# Patient Record
Sex: Female | Born: 1965 | Race: White | Hispanic: No | Smoking: Never smoker
Health system: Southern US, Community
[De-identification: ages and names within clinical notes are randomized; demographics above are authoritative.]

## PROBLEM LIST (undated history)

## (undated) DIAGNOSIS — J45909 Unspecified asthma, uncomplicated: Secondary | ICD-10-CM

## (undated) DIAGNOSIS — T7840XA Allergy, unspecified, initial encounter: Secondary | ICD-10-CM

## (undated) DIAGNOSIS — I10 Essential (primary) hypertension: Secondary | ICD-10-CM

## (undated) DIAGNOSIS — E785 Hyperlipidemia, unspecified: Secondary | ICD-10-CM

## (undated) HISTORY — PX: BREAST BIOPSY: SHX20

## (undated) HISTORY — DX: Allergy, unspecified, initial encounter: T78.40XA

## (undated) HISTORY — DX: Hyperlipidemia, unspecified: E78.5

## (undated) HISTORY — PX: THERAPEUTIC ABORTION: SHX798

## (undated) HISTORY — DX: Essential (primary) hypertension: I10

## (undated) HISTORY — PX: TUBAL LIGATION: SHX77

## (undated) HISTORY — PX: ENDOMETRIAL ABLATION: SHX621

## (undated) HISTORY — DX: Unspecified asthma, uncomplicated: J45.909

---

## 1997-08-03 HISTORY — PX: BREAST LUMPECTOMY: SHX2

## 2015-08-04 HISTORY — PX: REDUCTION MAMMAPLASTY: SUR839

## 2017-06-10 LAB — HM PAP SMEAR: HM Pap smear: NEGATIVE

## 2017-08-03 HISTORY — PX: COLONOSCOPY: SHX174

## 2018-08-03 DIAGNOSIS — J069 Acute upper respiratory infection, unspecified: Secondary | ICD-10-CM | POA: Diagnosis not present

## 2018-08-03 DIAGNOSIS — J45909 Unspecified asthma, uncomplicated: Secondary | ICD-10-CM | POA: Diagnosis not present

## 2018-08-27 DIAGNOSIS — J209 Acute bronchitis, unspecified: Secondary | ICD-10-CM | POA: Diagnosis not present

## 2018-09-20 DIAGNOSIS — N390 Urinary tract infection, site not specified: Secondary | ICD-10-CM | POA: Diagnosis not present

## 2018-09-20 DIAGNOSIS — I10 Essential (primary) hypertension: Secondary | ICD-10-CM | POA: Diagnosis not present

## 2018-09-20 DIAGNOSIS — Z0000 Encounter for general adult medical examination without abnormal findings: Secondary | ICD-10-CM | POA: Diagnosis not present

## 2018-09-20 DIAGNOSIS — J45990 Exercise induced bronchospasm: Secondary | ICD-10-CM | POA: Diagnosis not present

## 2018-09-20 DIAGNOSIS — J45998 Other asthma: Secondary | ICD-10-CM | POA: Diagnosis not present

## 2018-11-14 DIAGNOSIS — J0100 Acute maxillary sinusitis, unspecified: Secondary | ICD-10-CM | POA: Diagnosis not present

## 2019-01-13 DIAGNOSIS — Z0001 Encounter for general adult medical examination with abnormal findings: Secondary | ICD-10-CM | POA: Diagnosis not present

## 2019-01-13 DIAGNOSIS — N951 Menopausal and female climacteric states: Secondary | ICD-10-CM | POA: Diagnosis not present

## 2019-01-13 DIAGNOSIS — Z03818 Encounter for observation for suspected exposure to other biological agents ruled out: Secondary | ICD-10-CM | POA: Diagnosis not present

## 2019-01-13 DIAGNOSIS — R5383 Other fatigue: Secondary | ICD-10-CM | POA: Diagnosis not present

## 2019-01-23 DIAGNOSIS — N951 Menopausal and female climacteric states: Secondary | ICD-10-CM | POA: Diagnosis not present

## 2019-01-25 ENCOUNTER — Other Ambulatory Visit: Payer: Self-pay | Admitting: Family Medicine

## 2019-01-25 DIAGNOSIS — R102 Pelvic and perineal pain: Secondary | ICD-10-CM

## 2019-01-25 DIAGNOSIS — Z1231 Encounter for screening mammogram for malignant neoplasm of breast: Secondary | ICD-10-CM

## 2019-02-20 ENCOUNTER — Encounter: Payer: Self-pay | Admitting: Podiatry

## 2019-02-20 ENCOUNTER — Other Ambulatory Visit: Payer: Self-pay | Admitting: Podiatry

## 2019-02-20 ENCOUNTER — Other Ambulatory Visit: Payer: Self-pay

## 2019-02-20 ENCOUNTER — Ambulatory Visit: Payer: BC Managed Care – PPO | Admitting: Podiatry

## 2019-02-20 ENCOUNTER — Ambulatory Visit (INDEPENDENT_AMBULATORY_CARE_PROVIDER_SITE_OTHER): Payer: BC Managed Care – PPO

## 2019-02-20 VITALS — BP 115/75 | HR 69 | Temp 98.2°F | Resp 16

## 2019-02-20 DIAGNOSIS — M79671 Pain in right foot: Secondary | ICD-10-CM

## 2019-02-20 DIAGNOSIS — M722 Plantar fascial fibromatosis: Secondary | ICD-10-CM | POA: Diagnosis not present

## 2019-02-20 MED ORDER — DICLOFENAC SODIUM 75 MG PO TBEC: 75.0000 mg | DELAYED_RELEASE_TABLET | Freq: Two times a day (BID) | ORAL | 2 refills | Status: DC

## 2019-02-20 NOTE — Progress Notes (Signed)
   Subjective:    Patient ID: Andrea Hall, female    DOB: 10-05-1965, 53 y.o.   MRN: [id number]  HPI    Review of Systems  All other systems reviewed and are negative.      Objective:   Physical Exam        Assessment & Plan:

## 2019-02-20 NOTE — Patient Instructions (Signed)

## 2019-02-21 NOTE — Progress Notes (Signed)
Subjective:   Patient ID: Andrea Hall, female   DOB: 53 y.o.   MRN: [id number]   HPI Patient states having a lot of pain in the bottom of the right heel and states it is been present for least a month and maybe longer and worse when she gets up in the morning after sitting and has had history of this problem in the past.  Patient does not smoke likes to be active   Review of Systems  All other systems reviewed and are negative.       Objective:  Physical Exam Vitals signs and nursing note reviewed.  Constitutional:      Appearance: She is well-developed.  Pulmonary:     Effort: Pulmonary effort is normal.  Musculoskeletal: Normal range of motion.  Skin:    General: Skin is warm.  Neurological:     Mental Status: She is alert.     Neurovascular status intact muscle strength adequate range of motion within normal limits with patient found to have severe discomfort plantar aspect right heel at the insertion of the tendon into the calcaneus with inflammation fluid around the medial band and moderate depression of the arch     Assessment:  Acute plantar fasciitis right with inflammation fluid buildup     Plan:  H&P condition reviewed and recommended conservative treatment.  Sterile prep applied and injected the right plantar fascia 3 mg Kenalog 5 mg Xylocaine and applied fascial brace with instructions on usage along with physical therapy and placed on diclofenac 75 mg twice daily.  Reappoint 2 weeks to recheck and consider orthotics  X-rays indicate that there is spur formation with no indication to stress fracture arthritis

## 2019-03-13 ENCOUNTER — Ambulatory Visit: Payer: BC Managed Care – PPO | Admitting: Podiatry

## 2019-03-27 ENCOUNTER — Ambulatory Visit: Payer: Self-pay | Admitting: Podiatry

## 2019-03-28 ENCOUNTER — Ambulatory Visit: Payer: Self-pay

## 2019-06-15 ENCOUNTER — Ambulatory Visit (INDEPENDENT_AMBULATORY_CARE_PROVIDER_SITE_OTHER): Payer: Self-pay | Admitting: Bariatrics

## 2019-06-19 DIAGNOSIS — Z20828 Contact with and (suspected) exposure to other viral communicable diseases: Secondary | ICD-10-CM | POA: Diagnosis not present

## 2019-06-21 ENCOUNTER — Ambulatory Visit (INDEPENDENT_AMBULATORY_CARE_PROVIDER_SITE_OTHER): Payer: Self-pay | Admitting: Bariatrics

## 2019-08-23 DIAGNOSIS — J45909 Unspecified asthma, uncomplicated: Secondary | ICD-10-CM | POA: Diagnosis not present

## 2019-09-12 DIAGNOSIS — Z20828 Contact with and (suspected) exposure to other viral communicable diseases: Secondary | ICD-10-CM | POA: Diagnosis not present

## 2019-09-15 DIAGNOSIS — M62830 Muscle spasm of back: Secondary | ICD-10-CM | POA: Diagnosis not present

## 2019-09-15 DIAGNOSIS — M9905 Segmental and somatic dysfunction of pelvic region: Secondary | ICD-10-CM | POA: Diagnosis not present

## 2019-09-15 DIAGNOSIS — M9902 Segmental and somatic dysfunction of thoracic region: Secondary | ICD-10-CM | POA: Diagnosis not present

## 2019-09-15 DIAGNOSIS — M9903 Segmental and somatic dysfunction of lumbar region: Secondary | ICD-10-CM | POA: Diagnosis not present

## 2019-09-19 ENCOUNTER — Ambulatory Visit
Admission: RE | Admit: 2019-09-19 | Discharge: 2019-09-19 | Disposition: A | Discharge status: Home / Self Care | Payer: BC Managed Care – PPO | Source: Ambulatory Visit | Attending: Family Medicine | Admitting: Family Medicine

## 2019-09-19 ENCOUNTER — Other Ambulatory Visit: Payer: Self-pay

## 2019-09-19 DIAGNOSIS — Z1231 Encounter for screening mammogram for malignant neoplasm of breast: Secondary | ICD-10-CM | POA: Diagnosis not present

## 2019-09-19 LAB — HM MAMMOGRAPHY

## 2019-10-06 DIAGNOSIS — M9902 Segmental and somatic dysfunction of thoracic region: Secondary | ICD-10-CM | POA: Diagnosis not present

## 2019-10-06 DIAGNOSIS — M62830 Muscle spasm of back: Secondary | ICD-10-CM | POA: Diagnosis not present

## 2019-10-06 DIAGNOSIS — M9903 Segmental and somatic dysfunction of lumbar region: Secondary | ICD-10-CM | POA: Diagnosis not present

## 2019-10-06 DIAGNOSIS — M9905 Segmental and somatic dysfunction of pelvic region: Secondary | ICD-10-CM | POA: Diagnosis not present

## 2019-10-09 DIAGNOSIS — Z20828 Contact with and (suspected) exposure to other viral communicable diseases: Secondary | ICD-10-CM | POA: Diagnosis not present

## 2019-10-14 ENCOUNTER — Ambulatory Visit: Payer: BC Managed Care – PPO | Attending: Internal Medicine

## 2019-10-14 DIAGNOSIS — Z23 Encounter for immunization: Secondary | ICD-10-CM

## 2019-10-14 NOTE — Progress Notes (Signed)
   Covid-19 Vaccination Clinic  Name:  Juletta Berhe    MRN: [id number] DOB: 04-Nov-1965  10/14/2019  Ms. Mancia was observed post Covid-19 immunization for 15 minutes without incident. She was provided with Vaccine Information Sheet and instruction to access the V-Safe system.   Ms. Minami was instructed to call 911 with any severe reactions post vaccine: Marland Kitchen Difficulty breathing  . Swelling of face and throat  . A fast heartbeat  . A bad rash all over body  . Dizziness and weakness   Immunizations Administered    Name Date Dose VIS Date Route   Pfizer COVID-19 Vaccine 10/14/2019  8:44 AM 0.3 mL 07/14/2019 Intramuscular   Manufacturer: [employer], [employer]   Lot: [id number]   NDC: [address]-4

## 2019-11-06 ENCOUNTER — Ambulatory Visit: Payer: BC Managed Care – PPO

## 2019-11-06 ENCOUNTER — Ambulatory Visit: Payer: BC Managed Care – PPO | Attending: Internal Medicine

## 2019-11-06 DIAGNOSIS — Z23 Encounter for immunization: Secondary | ICD-10-CM

## 2019-11-06 NOTE — Progress Notes (Signed)
   Covid-19 Vaccination Clinic  Name:  Andrea Hall    MRN: [id number] DOB: [birth year]  11/06/2019  [name] was observed post Covid-19 immunization for 15 minutes without incident. She was provided with Vaccine Information Sheet and instruction to access the V-Safe system.   [name] was instructed to call 911 with any severe reactions post vaccine: Marland Kitchen Difficulty breathing  . Swelling of face and throat  . A fast heartbeat  . A bad rash all over body  . Dizziness and weakness   Immunizations Administered    Name Date Dose VIS Date Route   Pfizer COVID-19 Vaccine 11/06/2019  3:58 PM 0.3 mL 07/14/2019 Intramuscular   Manufacturer: [employer], [employer]   Lot: [id number]   NDC: [address]-0

## 2020-01-25 DIAGNOSIS — E538 Deficiency of other specified B group vitamins: Secondary | ICD-10-CM | POA: Diagnosis not present

## 2020-01-25 DIAGNOSIS — R635 Abnormal weight gain: Secondary | ICD-10-CM | POA: Diagnosis not present

## 2020-01-25 DIAGNOSIS — E559 Vitamin D deficiency, unspecified: Secondary | ICD-10-CM | POA: Diagnosis not present

## 2020-01-25 DIAGNOSIS — J45909 Unspecified asthma, uncomplicated: Secondary | ICD-10-CM | POA: Diagnosis not present

## 2020-01-25 DIAGNOSIS — L918 Other hypertrophic disorders of the skin: Secondary | ICD-10-CM | POA: Diagnosis not present

## 2020-01-25 DIAGNOSIS — N951 Menopausal and female climacteric states: Secondary | ICD-10-CM | POA: Diagnosis not present

## 2020-03-08 DIAGNOSIS — Z9189 Other specified personal risk factors, not elsewhere classified: Secondary | ICD-10-CM | POA: Diagnosis not present

## 2020-03-08 DIAGNOSIS — J45909 Unspecified asthma, uncomplicated: Secondary | ICD-10-CM | POA: Diagnosis not present

## 2020-03-08 DIAGNOSIS — E612 Magnesium deficiency: Secondary | ICD-10-CM | POA: Diagnosis not present

## 2020-03-08 DIAGNOSIS — N951 Menopausal and female climacteric states: Secondary | ICD-10-CM | POA: Diagnosis not present

## 2020-05-14 DIAGNOSIS — Z20822 Contact with and (suspected) exposure to covid-19: Secondary | ICD-10-CM | POA: Diagnosis not present

## 2020-05-14 DIAGNOSIS — Z03818 Encounter for observation for suspected exposure to other biological agents ruled out: Secondary | ICD-10-CM | POA: Diagnosis not present

## 2020-07-03 DIAGNOSIS — Z1321 Encounter for screening for nutritional disorder: Secondary | ICD-10-CM | POA: Diagnosis not present

## 2020-07-03 DIAGNOSIS — N951 Menopausal and female climacteric states: Secondary | ICD-10-CM | POA: Diagnosis not present

## 2020-07-03 DIAGNOSIS — E559 Vitamin D deficiency, unspecified: Secondary | ICD-10-CM | POA: Diagnosis not present

## 2020-07-03 DIAGNOSIS — J45909 Unspecified asthma, uncomplicated: Secondary | ICD-10-CM | POA: Diagnosis not present

## 2020-08-16 DIAGNOSIS — M9902 Segmental and somatic dysfunction of thoracic region: Secondary | ICD-10-CM | POA: Diagnosis not present

## 2020-08-16 DIAGNOSIS — M9905 Segmental and somatic dysfunction of pelvic region: Secondary | ICD-10-CM | POA: Diagnosis not present

## 2020-08-16 DIAGNOSIS — M62830 Muscle spasm of back: Secondary | ICD-10-CM | POA: Diagnosis not present

## 2020-08-16 DIAGNOSIS — M9903 Segmental and somatic dysfunction of lumbar region: Secondary | ICD-10-CM | POA: Diagnosis not present

## 2020-08-21 DIAGNOSIS — M62830 Muscle spasm of back: Secondary | ICD-10-CM | POA: Diagnosis not present

## 2020-08-21 DIAGNOSIS — M9903 Segmental and somatic dysfunction of lumbar region: Secondary | ICD-10-CM | POA: Diagnosis not present

## 2020-08-21 DIAGNOSIS — M9902 Segmental and somatic dysfunction of thoracic region: Secondary | ICD-10-CM | POA: Diagnosis not present

## 2020-08-21 DIAGNOSIS — M9905 Segmental and somatic dysfunction of pelvic region: Secondary | ICD-10-CM | POA: Diagnosis not present

## 2020-08-30 DIAGNOSIS — M9902 Segmental and somatic dysfunction of thoracic region: Secondary | ICD-10-CM | POA: Diagnosis not present

## 2020-08-30 DIAGNOSIS — M62830 Muscle spasm of back: Secondary | ICD-10-CM | POA: Diagnosis not present

## 2020-08-30 DIAGNOSIS — M9905 Segmental and somatic dysfunction of pelvic region: Secondary | ICD-10-CM | POA: Diagnosis not present

## 2020-08-30 DIAGNOSIS — M9903 Segmental and somatic dysfunction of lumbar region: Secondary | ICD-10-CM | POA: Diagnosis not present

## 2020-09-06 DIAGNOSIS — M9903 Segmental and somatic dysfunction of lumbar region: Secondary | ICD-10-CM | POA: Diagnosis not present

## 2020-09-06 DIAGNOSIS — M62830 Muscle spasm of back: Secondary | ICD-10-CM | POA: Diagnosis not present

## 2020-09-06 DIAGNOSIS — M9902 Segmental and somatic dysfunction of thoracic region: Secondary | ICD-10-CM | POA: Diagnosis not present

## 2020-09-06 DIAGNOSIS — M9905 Segmental and somatic dysfunction of pelvic region: Secondary | ICD-10-CM | POA: Diagnosis not present

## 2020-09-15 DIAGNOSIS — L03113 Cellulitis of right upper limb: Secondary | ICD-10-CM | POA: Diagnosis not present

## 2020-09-15 DIAGNOSIS — L739 Follicular disorder, unspecified: Secondary | ICD-10-CM | POA: Diagnosis not present

## 2020-09-25 DIAGNOSIS — E559 Vitamin D deficiency, unspecified: Secondary | ICD-10-CM | POA: Diagnosis not present

## 2020-09-25 DIAGNOSIS — Z1329 Encounter for screening for other suspected endocrine disorder: Secondary | ICD-10-CM | POA: Diagnosis not present

## 2020-09-25 DIAGNOSIS — Z789 Other specified health status: Secondary | ICD-10-CM | POA: Diagnosis not present

## 2020-09-25 DIAGNOSIS — R635 Abnormal weight gain: Secondary | ICD-10-CM | POA: Diagnosis not present

## 2020-09-25 DIAGNOSIS — J45909 Unspecified asthma, uncomplicated: Secondary | ICD-10-CM | POA: Diagnosis not present

## 2020-09-25 DIAGNOSIS — N951 Menopausal and female climacteric states: Secondary | ICD-10-CM | POA: Diagnosis not present

## 2020-09-25 DIAGNOSIS — Z1321 Encounter for screening for nutritional disorder: Secondary | ICD-10-CM | POA: Diagnosis not present

## 2020-09-27 DIAGNOSIS — M62830 Muscle spasm of back: Secondary | ICD-10-CM | POA: Diagnosis not present

## 2020-09-27 DIAGNOSIS — M9902 Segmental and somatic dysfunction of thoracic region: Secondary | ICD-10-CM | POA: Diagnosis not present

## 2020-09-27 DIAGNOSIS — M9905 Segmental and somatic dysfunction of pelvic region: Secondary | ICD-10-CM | POA: Diagnosis not present

## 2020-09-27 DIAGNOSIS — M9903 Segmental and somatic dysfunction of lumbar region: Secondary | ICD-10-CM | POA: Diagnosis not present

## 2020-09-29 ENCOUNTER — Emergency Department (HOSPITAL_COMMUNITY)
Admission: EM | Admit: 2020-09-29 | Discharge: 2020-09-29 | Disposition: A | Discharge status: Home / Self Care | Payer: BC Managed Care – PPO | Attending: Emergency Medicine | Admitting: Emergency Medicine

## 2020-09-29 ENCOUNTER — Encounter (HOSPITAL_COMMUNITY): Payer: Self-pay | Admitting: Emergency Medicine

## 2020-09-29 ENCOUNTER — Emergency Department (HOSPITAL_COMMUNITY): Payer: BC Managed Care – PPO

## 2020-09-29 ENCOUNTER — Other Ambulatory Visit: Payer: Self-pay

## 2020-09-29 DIAGNOSIS — W19XXXA Unspecified fall, initial encounter: Secondary | ICD-10-CM | POA: Diagnosis not present

## 2020-09-29 DIAGNOSIS — Y939 Activity, unspecified: Secondary | ICD-10-CM | POA: Insufficient documentation

## 2020-09-29 DIAGNOSIS — S93401A Sprain of unspecified ligament of right ankle, initial encounter: Secondary | ICD-10-CM

## 2020-09-29 DIAGNOSIS — Y999 Unspecified external cause status: Secondary | ICD-10-CM | POA: Diagnosis not present

## 2020-09-29 DIAGNOSIS — M7989 Other specified soft tissue disorders: Secondary | ICD-10-CM | POA: Diagnosis not present

## 2020-09-29 DIAGNOSIS — Y929 Unspecified place or not applicable: Secondary | ICD-10-CM | POA: Diagnosis not present

## 2020-09-29 DIAGNOSIS — M79671 Pain in right foot: Secondary | ICD-10-CM | POA: Diagnosis not present

## 2020-09-29 DIAGNOSIS — S99911A Unspecified injury of right ankle, initial encounter: Secondary | ICD-10-CM | POA: Diagnosis not present

## 2020-09-29 MED ORDER — OXYCODONE-ACETAMINOPHEN 5-325 MG PO TABS
1.0000 | ORAL_TABLET | Freq: Once | ORAL | Status: DC
  Filled 2020-09-29: qty 1

## 2020-09-29 MED ORDER — ACETAMINOPHEN 500 MG PO TABS
1000.0000 mg | ORAL_TABLET | Freq: Once | ORAL | Status: AC
  Administered 2020-09-29: 1000 mg via ORAL
  Filled 2020-09-29: qty 2

## 2020-09-29 MED ORDER — ONDANSETRON HCL 4 MG PO TABS
4.0000 mg | ORAL_TABLET | Freq: Once | ORAL | Status: DC
  Filled 2020-09-29: qty 1

## 2020-09-29 NOTE — ED Triage Notes (Signed)
Pt c/o right ankle pain after a fall today. Pt denies LOC, did not hit her head.

## 2020-09-29 NOTE — Progress Notes (Signed)
Orthopedic Tech Progress Note Patient Details:  Andrea Hall 12/13/1965 [id number]  Ortho Devices Type of Ortho Device: CAM walker Ortho Device/Splint Location: rle Ortho Device/Splint Interventions: Ordered,Application,Adjustment   Post Interventions Patient Tolerated: Well Instructions Provided: Care of device,Adjustment of device   Trinna Post 09/29/2020, 7:51 PM

## 2020-09-29 NOTE — Discharge Instructions (Signed)
You came to the emergency room today to have your right ankle pain evaluated.  X-ray showed a tiny avulsion fracture off of the lateral malleolus.  This likely due to any ankle sprain.  We will place him in a walking boot.  He may remove the boot when you are resting or sleeping.  Please try to rest, elevate, and ice your ankle to reduce swelling.  Please take Ibuprofen (Advil, motrin) and Tylenol (acetaminophen) to relieve your pain.    You may take up to 600 MG (3 pills) of normal strength ibuprofen every 8 hours as needed.   You make take tylenol, up to 1,000 mg (two extra strength pills) every 8 hours as needed.   It is safe to take ibuprofen and tylenol at the same time as they work differently.   Do not take more than 3,000 mg tylenol in a 24 hour period (not more than one dose every 8 hours.  Please check all medication labels as many medications such as pain and cold medications may contain tylenol.  Do not drink alcohol while taking these medications.  Do not take other NSAID'S while taking ibuprofen (such as aleve or naproxen).  Please take ibuprofen with food to decrease stomach upset.   Get help right away if: Your foot or toes become numb or blue. You have severe pain that gets worse.

## 2020-09-29 NOTE — ED Notes (Signed)
Ortho tech called for brace placement.

## 2020-09-29 NOTE — ED Provider Notes (Signed)
[facility]   CSN: [id number] Arrival date & time: 09/29/20  1818     History Chief Complaint  Patient presents with  . Ankle Pain    Andrea Hall is a 55 y.o. female with no pertinent history.  Patient presents with chief complaint of right ankle pain.  Patient reports that approximately 1 hour prior she lost her balance and fell.  Fall was at on slightly elevated step.   Denies hitting her head or any loss of consciousness.  Patient reports that during fall she inverted her right foot and felt a "snap."  Patient is currently complaining of pain to the lateral aspect of her right ankle.  Pain radiates into her foot.  Rates her pain as a 6/10.  Worse with weightbearing or movement.  Patient denies any alleviating factors.  Reports that she has been nonweightbearing since the fall.  She has been using crutches to ambulate.    Patient denies any neck or back pain, numbness or tingling to extremities, weakness in extremities, saddle anesthesia, bowel or bladder dysfunction, visual disturbance, nausea, vomiting, lightheadedness, dizziness, syncopal episode.  HPI     History reviewed. No pertinent past medical history.  There are no problems to display for this patient.   Past Surgical History:  Procedure Laterality Date  . BREAST LUMPECTOMY Right 1999  . REDUCTION MAMMAPLASTY Bilateral 2017     OB History   No obstetric history on file.     Family History  Problem Relation Age of Onset  . Breast cancer Mother   . Breast cancer Maternal Aunt   . Breast cancer Paternal Grandmother     Social History   Tobacco Use  . Smoking status: Never Smoker  . Smokeless tobacco: Never Used    Home Medications Prior to Admission medications   Medication Sig Start Date End Date Taking? Authorizing Provider  diclofenac (VOLTAREN) 75 MG EC tablet Take 1 tablet (75 mg total) by mouth 2 (two) times daily. 02/20/19   Lenn Sinkegal, Norman  S, DPM  MYRBETRIQ 25 MG TB24 tablet  02/09/19   [provider]  [facility] 160-4.5 MCG/ACT inhaler  10/19/18   [provider]    Allergies    Codeine  Review of Systems   Review of Systems  Eyes: Negative for visual disturbance.  Gastrointestinal: Negative for abdominal pain, nausea and vomiting.  Genitourinary: Negative for difficulty urinating.  Musculoskeletal: Positive for arthralgias, gait problem and joint swelling. Negative for back pain and neck pain.  Skin: Negative for color change and wound.  Neurological: Negative for dizziness, tremors, seizures, syncope, facial asymmetry, speech difficulty, weakness, light-headedness, numbness and headaches.  Psychiatric/Behavioral: Negative for confusion.    Physical Exam Updated Vital Signs There were no vitals taken for this visit.  Physical Exam Vitals and nursing note reviewed.  Constitutional:      General: She is not in acute distress.    Appearance: She is not ill-appearing, toxic-appearing or diaphoretic.  HENT:     Head: Normocephalic and atraumatic. No raccoon eyes, Battle's sign, abrasion, contusion, masses, right periorbital erythema, left periorbital erythema or laceration.     Jaw: No trismus or pain on movement.  Eyes:     General: No scleral icterus.       Right eye: No discharge.        Left eye: No discharge.  Cardiovascular:     Rate and Rhythm: Normal rate.  Pulmonary:     Effort: Pulmonary  effort is normal.  Musculoskeletal:     Right shoulder: No swelling, deformity, effusion, tenderness or bony tenderness. Normal range of motion.     Left shoulder: No swelling, deformity, effusion, tenderness or bony tenderness. Normal range of motion.     Right upper arm: No swelling, edema, deformity, tenderness or bony tenderness.     Left upper arm: No swelling, edema, deformity, tenderness or bony tenderness.     Right elbow: Normal.     Left elbow: Normal.     Right forearm: Normal.     Left  forearm: Normal.     Right wrist: No swelling, deformity, effusion, lacerations, tenderness, bony tenderness or snuff box tenderness. Normal range of motion.     Left wrist: No swelling, deformity, effusion, lacerations, tenderness, bony tenderness or snuff box tenderness. Normal range of motion.     Right hand: No swelling, deformity, lacerations, tenderness or bony tenderness. Normal range of motion. Normal sensation.     Left hand: No swelling, deformity, lacerations, tenderness or bony tenderness. Normal range of motion. Normal sensation.     Cervical back: No swelling, edema, deformity, erythema, signs of trauma, lacerations, rigidity, spasms, torticollis, tenderness, bony tenderness or crepitus. No pain with movement. Normal range of motion.     Thoracic back: No swelling, edema, deformity, signs of trauma, spasms, tenderness or bony tenderness.     Lumbar back: No swelling, edema, deformity, signs of trauma, spasms, tenderness or bony tenderness.     Right hip: No deformity, tenderness or bony tenderness.     Left hip: No deformity, tenderness or bony tenderness.     Right upper leg: No swelling, edema, deformity, tenderness or bony tenderness.     Left upper leg: No swelling, edema, deformity, tenderness or bony tenderness.     Right knee: No swelling, deformity, effusion, erythema, ecchymosis, lacerations or bony tenderness. Normal range of motion. No tenderness. Normal alignment.     Left knee: No swelling, deformity, effusion or bony tenderness. Normal range of motion. No tenderness. Normal alignment.     Right lower leg: Normal.     Left lower leg: Normal.     Right ankle: Swelling present. No deformity, ecchymosis or lacerations. Tenderness present over the lateral malleolus. Decreased range of motion. Normal pulse.     Left ankle: No swelling, deformity, ecchymosis or lacerations. No tenderness. Normal range of motion. Normal pulse.     Right foot: Normal range of motion and normal  capillary refill. No swelling, deformity, laceration, tenderness or bony tenderness. Normal pulse.     Left foot: Normal range of motion and normal capillary refill. No swelling, deformity, laceration, tenderness or bony tenderness. Normal pulse.     Comments: No tenderness to spinous process, step-off, or deformity noted to cervical, thoracic, or lumbar spine  Swelling and tenderness to lateral aspect of right ankle, no deformity, bruising noted.  Decreased range of motion due to complaints of pain.  +3 dorsalis pedis pulse and posterior tibialis pulse on right foot and ankle.  Skin:    General: Skin is warm and dry.  Neurological:     General: No focal deficit present.     Mental Status: She is alert.     GCS: GCS eye subscore is 4. GCS verbal subscore is 5. GCS motor subscore is 6.     Cranial Nerves: No facial asymmetry.     Sensory: Sensation is intact.  Psychiatric:        Behavior: Behavior is cooperative.  ED Results / Procedures / Treatments   Labs (all labs ordered are listed, but only abnormal results are displayed) Labs Reviewed - No data to display  EKG None  Radiology DG Ankle Complete Right  Result Date: 09/29/2020 CLINICAL DATA:  Pain post fall. EXAM: RIGHT ANKLE - COMPLETE 3+ VIEW COMPARISON:  None. FINDINGS: There is no evidence of displaced fracture, dislocation, or joint effusion. Possible tiny avulsion fracture off of the lateral malleolus. Plantar calcaneal spur. There is no evidence of arthropathy or other focal bone abnormality. Soft tissue swelling about the lateral malleolus. IMPRESSION: 1. Possible tiny avulsion fracture off of the lateral malleolus with overlying soft tissue swelling. 2. Plantar calcaneal spur. Electronically Signed   By: Ted Mcalpine M.D.   On: 09/29/2020 18:57   DG Foot Complete Right  Result Date: 09/29/2020 CLINICAL DATA:  Foot pain post fall. EXAM: RIGHT FOOT COMPLETE - 3+ VIEW COMPARISON:  None. FINDINGS: There is no  evidence of fracture or dislocation of the right foot. There is no evidence of arthropathy or other focal bone abnormality. Possible tiny avulsion fracture off of the lateral malleolus. Soft tissues are unremarkable. IMPRESSION: 1. No acute fracture or dislocation identified about the right foot. 2. Possible tiny avulsion fracture off of the lateral malleolus. Electronically Signed   By: Ted Mcalpine M.D.   On: 09/29/2020 19:04    Procedures Procedures   Medications Ordered in ED Medications  acetaminophen (TYLENOL) tablet 1,000 mg (1,000 mg Oral Given 09/29/20 1916)    ED Course  I have reviewed the triage vital signs and the nursing notes.  Pertinent labs & imaging results that were available during my care of the patient were reviewed by me and considered in my medical decision making (see chart for details).    MDM Rules/Calculators/A&P                          Alert 55 year old female no acute distress, nontoxic-appearing.  Patient presents with chief planing of right ankle and foot pain after suffering a fall.  She reports that she converted to her right foot and is having pain and swelling to lateral aspect of her ankle.  Patient denies any head or any loss of consciousness, neck or back pain, numbness or tingling to extremities, weakness in extremities, saddle anesthesia, bowel or bladder dysfunction, visual disturbance, nausea, vomiting, lightheadedness, dizziness, syncopal episode.  No tenderness to spinous process, step-off, or deformity noted to cervical, thoracic, or lumbar spine.  Has full range of motion of neck.  Head is atraumatic with no battle signs or raccoon eyes.  Patient able to move all extremities equally without difficulty.  Low suspicion for intracranial injury or C-spine injury.  Swelling and tenderness to lateral aspect of right ankle, no deformity, bruising noted.  Decreased range of motion due to complaints of pain.  +3 dorsalis pedis pulse and posterior  tibialis pulse on right foot and ankle.  X-ray of right foot and ankle pain.  X-ray showed possible tiny lucent fracture off of the lateral malleolus with overlying soft tissue swelling.  Patient was offered opiate pain medication however declined.  Patient will be given 1000 mg Tylenol placed in cam walker.  Patient to follow-up with orthopedic in outpatient setting.  Given information to rest, ice, and elevate ankle as much as possible.  Discussed results, findings, treatment and follow up. Patient advised of return precautions. Patient verbalized understanding and agreed with plan.   Final Clinical  Impression(s) / ED Diagnoses Final diagnoses:  Sprain of right ankle, unspecified ligament, initial encounter    Rx / DC Orders ED Discharge Orders    None       Berneice Heinrich 09/30/20 Ileana Ladd, MD 10/01/20 [phone]

## 2020-10-02 DIAGNOSIS — S82891A Other fracture of right lower leg, initial encounter for closed fracture: Secondary | ICD-10-CM | POA: Diagnosis not present

## 2020-10-11 DIAGNOSIS — M9905 Segmental and somatic dysfunction of pelvic region: Secondary | ICD-10-CM | POA: Diagnosis not present

## 2020-10-11 DIAGNOSIS — M62830 Muscle spasm of back: Secondary | ICD-10-CM | POA: Diagnosis not present

## 2020-10-11 DIAGNOSIS — M9902 Segmental and somatic dysfunction of thoracic region: Secondary | ICD-10-CM | POA: Diagnosis not present

## 2020-10-11 DIAGNOSIS — M9903 Segmental and somatic dysfunction of lumbar region: Secondary | ICD-10-CM | POA: Diagnosis not present

## 2020-10-28 DIAGNOSIS — S82891D Other fracture of right lower leg, subsequent encounter for closed fracture with routine healing: Secondary | ICD-10-CM | POA: Diagnosis not present

## 2020-10-29 DIAGNOSIS — M9902 Segmental and somatic dysfunction of thoracic region: Secondary | ICD-10-CM | POA: Diagnosis not present

## 2020-10-29 DIAGNOSIS — M62830 Muscle spasm of back: Secondary | ICD-10-CM | POA: Diagnosis not present

## 2020-10-29 DIAGNOSIS — M9903 Segmental and somatic dysfunction of lumbar region: Secondary | ICD-10-CM | POA: Diagnosis not present

## 2020-10-29 DIAGNOSIS — M9905 Segmental and somatic dysfunction of pelvic region: Secondary | ICD-10-CM | POA: Diagnosis not present

## 2020-11-04 DIAGNOSIS — Z20822 Contact with and (suspected) exposure to covid-19: Secondary | ICD-10-CM | POA: Diagnosis not present

## 2020-11-08 DIAGNOSIS — M9902 Segmental and somatic dysfunction of thoracic region: Secondary | ICD-10-CM | POA: Diagnosis not present

## 2020-11-08 DIAGNOSIS — M9905 Segmental and somatic dysfunction of pelvic region: Secondary | ICD-10-CM | POA: Diagnosis not present

## 2020-11-08 DIAGNOSIS — M62830 Muscle spasm of back: Secondary | ICD-10-CM | POA: Diagnosis not present

## 2020-11-08 DIAGNOSIS — M9903 Segmental and somatic dysfunction of lumbar region: Secondary | ICD-10-CM | POA: Diagnosis not present

## 2020-11-11 ENCOUNTER — Encounter: Payer: Self-pay | Admitting: Medical

## 2020-11-11 ENCOUNTER — Other Ambulatory Visit: Payer: Self-pay

## 2020-11-11 ENCOUNTER — Ambulatory Visit: Payer: BC Managed Care – PPO | Admitting: Medical

## 2020-11-11 VITALS — BP 130/86 | HR 76 | Ht 64.0 in | Wt 212.0 lb

## 2020-11-11 DIAGNOSIS — J4540 Moderate persistent asthma, uncomplicated: Secondary | ICD-10-CM

## 2020-11-11 DIAGNOSIS — Z1231 Encounter for screening mammogram for malignant neoplasm of breast: Secondary | ICD-10-CM | POA: Diagnosis not present

## 2020-11-11 DIAGNOSIS — J301 Allergic rhinitis due to pollen: Secondary | ICD-10-CM | POA: Insufficient documentation

## 2020-11-11 DIAGNOSIS — Z136 Encounter for screening for cardiovascular disorders: Secondary | ICD-10-CM | POA: Insufficient documentation

## 2020-11-11 DIAGNOSIS — Z823 Family history of stroke: Secondary | ICD-10-CM

## 2020-11-11 DIAGNOSIS — Z8249 Family history of ischemic heart disease and other diseases of the circulatory system: Secondary | ICD-10-CM | POA: Diagnosis not present

## 2020-11-11 MED ORDER — SYMBICORT 160-4.5 MCG/ACT IN AERO: 2.0000 | INHALATION_SPRAY | Freq: Two times a day (BID) | RESPIRATORY_TRACT | 3 refills | Status: DC

## 2020-11-11 NOTE — Progress Notes (Signed)
Done

## 2020-11-11 NOTE — Progress Notes (Signed)
Subjective: Chief Complaint  Patient presents with  . New Patient (Initial Visit)   Here as a new patient to establish care.  Andrea Hall since 09/2019 Prior PCP, Dr. Nadyne Hall [facility] chiropractor, Dr. Maryruth Hall for back and muscular issues  Been seeing integrative therapy x 1 year, had lots of labs, found some hormone issues and food sensitives.   Has changed a lot of things in diet.  Had updated set of labs just recently last month.  Has had issues with high blood pressure  Had covid test yesterday as precaution since she is trying to visit family.  At that clinic had elevated BP readings.   Trying to work on weight loss, healthy lifestyle.  Needs mammogram.   She notes last year getting diagnosed with specific mold allergy.  Has hx/o asthma.  Used to be exercise induced, but since moving to [place] now has general asthma problems.  Has been on Symbicort during parts of the year with allergies or with exercise.   Needs refill on Symbicort.    Fell 1 month ago on wet surface, broke ankle.  Was in air cast 2-3 weeks.  Is being managed by orthopedics  Back in 2016 had  Dyspnea, had stress test and was told heart was fine, but had "crappy lungs"   Past Medical History:  Diagnosis Date  . Allergy   . Asthma   . Hyperlipidemia   . Hypertension     Current Outpatient Medications on File Prior to Visit  Medication Sig Dispense Refill  . estradiol (VIVELLE-DOT) 0.0375 MG/24HR Place onto the skin.    Marland Kitchen ibuprofen (ADVIL) 200 MG tablet Take by mouth.    . progesterone (PROMETRIUM) 100 MG capsule     . MYRBETRIQ 25 MG TB24 tablet  (Patient not taking: Reported on 11/11/2020)     No current facility-administered medications on file prior to visit.   ROS as in subjective   Objective: BP 130/86   Pulse 76   Ht 5\' 4"  (1.626 m)   Wt 212 lb (96.2 kg)   SpO2 96%   BMI 36.39 kg/m   General appearence: alert, no distress, WD/WN,  neck: supple, no  lymphadenopathy, no thyromegaly, no masses Heart: RRR, normal S1, S2, no murmurs Lungs: CTA bilaterally, no wheezes, rhonchi, or rales Pulses: 2+ symmetric, upper and lower extremities, normal cap refill Ext: no edema     Assessment: Encounter Diagnoses  Name Primary?  . Moderate persistent asthma without complication Yes  . Allergic rhinitis due to pollen, unspecified seasonality   . Encounter for screening mammogram for malignant neoplasm of breast   . Family history of heart disease   . Family history of stroke   . Screening for heart disease      Plan: Asthma and allergies  Screen for breast cancer - she will schedule mammogram  Return soon for fasting physical  She will get copies of recent labs, and we will have her sign for last colonoscopy  Screen for heart disease, family history of heart disease - refer for CT calcium score  Andrea Hall was seen today for new patient (initial visit).  Diagnoses and all orders for this visit:  Moderate persistent asthma without complication  Allergic rhinitis due to pollen, unspecified seasonality  Encounter for screening mammogram for malignant neoplasm of breast -     MM DIGITAL SCREENING BILATERAL; Future  Family history of heart disease  Family history of stroke  Screening for heart disease  Other orders -  SYMBICORT 160-4.5 MCG/ACT inhaler; Inhale 2 puffs into the lungs in the morning and at bedtime. -     CT CARDIAC SCORING (SELF PAY ONLY); Future   F/u soon pending records, soon for fasting physical

## 2020-11-11 NOTE — Patient Instructions (Signed)
Please call to schedule your mammogram.   [facility]  [phone] [address], [address] [place], [place] [address]  

## 2020-11-14 DIAGNOSIS — M62830 Muscle spasm of back: Secondary | ICD-10-CM | POA: Diagnosis not present

## 2020-11-14 DIAGNOSIS — M9905 Segmental and somatic dysfunction of pelvic region: Secondary | ICD-10-CM | POA: Diagnosis not present

## 2020-11-14 DIAGNOSIS — M9903 Segmental and somatic dysfunction of lumbar region: Secondary | ICD-10-CM | POA: Diagnosis not present

## 2020-11-14 DIAGNOSIS — M9902 Segmental and somatic dysfunction of thoracic region: Secondary | ICD-10-CM | POA: Diagnosis not present

## 2020-11-22 DIAGNOSIS — M9905 Segmental and somatic dysfunction of pelvic region: Secondary | ICD-10-CM | POA: Diagnosis not present

## 2020-11-22 DIAGNOSIS — M9902 Segmental and somatic dysfunction of thoracic region: Secondary | ICD-10-CM | POA: Diagnosis not present

## 2020-11-22 DIAGNOSIS — M9903 Segmental and somatic dysfunction of lumbar region: Secondary | ICD-10-CM | POA: Diagnosis not present

## 2020-11-22 DIAGNOSIS — M62830 Muscle spasm of back: Secondary | ICD-10-CM | POA: Diagnosis not present

## 2020-11-23 ENCOUNTER — Telehealth: Payer: Self-pay

## 2020-11-23 NOTE — Telephone Encounter (Signed)
P.A. [employer] completed

## 2020-11-29 NOTE — Telephone Encounter (Signed)
P.A. approved, sent pt mychart message 

## 2021-01-17 DIAGNOSIS — M9905 Segmental and somatic dysfunction of pelvic region: Secondary | ICD-10-CM | POA: Diagnosis not present

## 2021-01-17 DIAGNOSIS — M62838 Other muscle spasm: Secondary | ICD-10-CM | POA: Diagnosis not present

## 2021-01-17 DIAGNOSIS — M9903 Segmental and somatic dysfunction of lumbar region: Secondary | ICD-10-CM | POA: Diagnosis not present

## 2021-01-17 DIAGNOSIS — M9902 Segmental and somatic dysfunction of thoracic region: Secondary | ICD-10-CM | POA: Diagnosis not present

## 2021-01-17 DIAGNOSIS — M62830 Muscle spasm of back: Secondary | ICD-10-CM | POA: Diagnosis not present

## 2021-01-24 DIAGNOSIS — M9903 Segmental and somatic dysfunction of lumbar region: Secondary | ICD-10-CM | POA: Diagnosis not present

## 2021-01-24 DIAGNOSIS — E559 Vitamin D deficiency, unspecified: Secondary | ICD-10-CM | POA: Diagnosis not present

## 2021-01-24 DIAGNOSIS — M62838 Other muscle spasm: Secondary | ICD-10-CM | POA: Diagnosis not present

## 2021-01-24 DIAGNOSIS — N951 Menopausal and female climacteric states: Secondary | ICD-10-CM | POA: Diagnosis not present

## 2021-01-24 DIAGNOSIS — J45909 Unspecified asthma, uncomplicated: Secondary | ICD-10-CM | POA: Diagnosis not present

## 2021-01-24 DIAGNOSIS — E538 Deficiency of other specified B group vitamins: Secondary | ICD-10-CM | POA: Diagnosis not present

## 2021-01-24 DIAGNOSIS — M9902 Segmental and somatic dysfunction of thoracic region: Secondary | ICD-10-CM | POA: Diagnosis not present

## 2021-01-24 DIAGNOSIS — Z1321 Encounter for screening for nutritional disorder: Secondary | ICD-10-CM | POA: Diagnosis not present

## 2021-01-24 DIAGNOSIS — M62830 Muscle spasm of back: Secondary | ICD-10-CM | POA: Diagnosis not present

## 2021-01-24 DIAGNOSIS — R635 Abnormal weight gain: Secondary | ICD-10-CM | POA: Diagnosis not present

## 2021-01-24 DIAGNOSIS — M9905 Segmental and somatic dysfunction of pelvic region: Secondary | ICD-10-CM | POA: Diagnosis not present

## 2021-02-05 DIAGNOSIS — M9903 Segmental and somatic dysfunction of lumbar region: Secondary | ICD-10-CM | POA: Diagnosis not present

## 2021-02-05 DIAGNOSIS — M62830 Muscle spasm of back: Secondary | ICD-10-CM | POA: Diagnosis not present

## 2021-02-05 DIAGNOSIS — M9905 Segmental and somatic dysfunction of pelvic region: Secondary | ICD-10-CM | POA: Diagnosis not present

## 2021-02-05 DIAGNOSIS — M9902 Segmental and somatic dysfunction of thoracic region: Secondary | ICD-10-CM | POA: Diagnosis not present

## 2021-02-05 DIAGNOSIS — M62838 Other muscle spasm: Secondary | ICD-10-CM | POA: Diagnosis not present

## 2021-02-06 DIAGNOSIS — N951 Menopausal and female climacteric states: Secondary | ICD-10-CM | POA: Diagnosis not present

## 2021-02-06 DIAGNOSIS — E559 Vitamin D deficiency, unspecified: Secondary | ICD-10-CM | POA: Diagnosis not present

## 2021-02-06 DIAGNOSIS — J45909 Unspecified asthma, uncomplicated: Secondary | ICD-10-CM | POA: Diagnosis not present

## 2021-02-06 DIAGNOSIS — Z1321 Encounter for screening for nutritional disorder: Secondary | ICD-10-CM | POA: Diagnosis not present

## 2021-02-21 DIAGNOSIS — M9903 Segmental and somatic dysfunction of lumbar region: Secondary | ICD-10-CM | POA: Diagnosis not present

## 2021-02-21 DIAGNOSIS — M62838 Other muscle spasm: Secondary | ICD-10-CM | POA: Diagnosis not present

## 2021-02-21 DIAGNOSIS — M9902 Segmental and somatic dysfunction of thoracic region: Secondary | ICD-10-CM | POA: Diagnosis not present

## 2021-02-21 DIAGNOSIS — M62830 Muscle spasm of back: Secondary | ICD-10-CM | POA: Diagnosis not present

## 2021-02-21 DIAGNOSIS — M9905 Segmental and somatic dysfunction of pelvic region: Secondary | ICD-10-CM | POA: Diagnosis not present

## 2021-03-07 DIAGNOSIS — M9905 Segmental and somatic dysfunction of pelvic region: Secondary | ICD-10-CM | POA: Diagnosis not present

## 2021-03-07 DIAGNOSIS — M62838 Other muscle spasm: Secondary | ICD-10-CM | POA: Diagnosis not present

## 2021-03-07 DIAGNOSIS — M9902 Segmental and somatic dysfunction of thoracic region: Secondary | ICD-10-CM | POA: Diagnosis not present

## 2021-03-07 DIAGNOSIS — M9903 Segmental and somatic dysfunction of lumbar region: Secondary | ICD-10-CM | POA: Diagnosis not present

## 2021-03-07 DIAGNOSIS — M62830 Muscle spasm of back: Secondary | ICD-10-CM | POA: Diagnosis not present

## 2021-03-17 DIAGNOSIS — H2513 Age-related nuclear cataract, bilateral: Secondary | ICD-10-CM | POA: Diagnosis not present

## 2021-03-17 DIAGNOSIS — H5203 Hypermetropia, bilateral: Secondary | ICD-10-CM | POA: Diagnosis not present

## 2021-03-21 DIAGNOSIS — M62838 Other muscle spasm: Secondary | ICD-10-CM | POA: Diagnosis not present

## 2021-03-21 DIAGNOSIS — M9902 Segmental and somatic dysfunction of thoracic region: Secondary | ICD-10-CM | POA: Diagnosis not present

## 2021-03-21 DIAGNOSIS — M62830 Muscle spasm of back: Secondary | ICD-10-CM | POA: Diagnosis not present

## 2021-03-21 DIAGNOSIS — M9905 Segmental and somatic dysfunction of pelvic region: Secondary | ICD-10-CM | POA: Diagnosis not present

## 2021-03-21 DIAGNOSIS — M9903 Segmental and somatic dysfunction of lumbar region: Secondary | ICD-10-CM | POA: Diagnosis not present

## 2021-03-24 ENCOUNTER — Encounter: Payer: Self-pay | Admitting: Internal Medicine

## 2021-04-11 ENCOUNTER — Ambulatory Visit: Payer: BC Managed Care – PPO | Admitting: Medical

## 2021-04-11 ENCOUNTER — Other Ambulatory Visit (HOSPITAL_COMMUNITY)
Admission: RE | Admit: 2021-04-11 | Discharge: 2021-04-11 | Disposition: A | Discharge status: Home / Self Care | Payer: BC Managed Care – PPO | Source: Ambulatory Visit | Attending: Medical | Admitting: Medical

## 2021-04-11 ENCOUNTER — Other Ambulatory Visit: Payer: Self-pay

## 2021-04-11 ENCOUNTER — Encounter: Payer: Self-pay | Admitting: Medical

## 2021-04-11 ENCOUNTER — Other Ambulatory Visit: Payer: Self-pay | Admitting: Medical

## 2021-04-11 VITALS — BP 120/70 | HR 80 | Wt 210.8 lb

## 2021-04-11 DIAGNOSIS — Z0000 Encounter for general adult medical examination without abnormal findings: Secondary | ICD-10-CM | POA: Insufficient documentation

## 2021-04-11 DIAGNOSIS — Z1211 Encounter for screening for malignant neoplasm of colon: Secondary | ICD-10-CM

## 2021-04-11 DIAGNOSIS — Z136 Encounter for screening for cardiovascular disorders: Secondary | ICD-10-CM | POA: Diagnosis not present

## 2021-04-11 DIAGNOSIS — Z124 Encounter for screening for malignant neoplasm of cervix: Secondary | ICD-10-CM | POA: Insufficient documentation

## 2021-04-11 DIAGNOSIS — Z8249 Family history of ischemic heart disease and other diseases of the circulatory system: Secondary | ICD-10-CM | POA: Diagnosis not present

## 2021-04-11 DIAGNOSIS — Z1231 Encounter for screening mammogram for malignant neoplasm of breast: Secondary | ICD-10-CM | POA: Diagnosis not present

## 2021-04-11 DIAGNOSIS — J4540 Moderate persistent asthma, uncomplicated: Secondary | ICD-10-CM

## 2021-04-11 DIAGNOSIS — J301 Allergic rhinitis due to pollen: Secondary | ICD-10-CM

## 2021-04-11 DIAGNOSIS — Z823 Family history of stroke: Secondary | ICD-10-CM

## 2021-04-11 DIAGNOSIS — R1031 Right lower quadrant pain: Secondary | ICD-10-CM | POA: Insufficient documentation

## 2021-04-11 MED ORDER — SYMBICORT 160-4.5 MCG/ACT IN AERO
2.0000 | INHALATION_SPRAY | Freq: Two times a day (BID) | RESPIRATORY_TRACT | 11 refills | Status: DC
Start: 2021-04-11 — End: 2021-04-11

## 2021-04-11 MED ORDER — ALBUTEROL SULFATE HFA 108 (90 BASE) MCG/ACT IN AERS: 2.0000 | INHALATION_SPRAY | Freq: Four times a day (QID) | RESPIRATORY_TRACT | 1 refills | Status: DC | PRN

## 2021-04-11 MED ORDER — SYMBICORT 160-4.5 MCG/ACT IN AERO: 2.0000 | INHALATION_SPRAY | Freq: Two times a day (BID) | RESPIRATORY_TRACT | 11 refills | Status: DC

## 2021-04-11 NOTE — Progress Notes (Signed)
Subjective:   HPI  Andrea Hall is a 55 y.o. female who presents for Chief Complaint  Patient presents with   wants referrals    Wants referral for cologuard and mammogram. Would like pap smear.     Patient Care Team: Jakyra Kenealy, Cleda Mccreedy as PCP - General (Family Medicine) Sees dentist Sees eye doctor [facility] in [place]   Concerns: Lately having some discomfort in right lower abdomen, feels pulsatile.  No bowel or bladder changes, no vaginal bleeding  Likely in menopause.  No periods in last year, hx/o ablation of uterus as well  Needs updated colon and cervical cancer screens.  Had cologard 3-4 years ago, last pap 2018.  Sees [facility] regularly, had a bunch of recent labs through their office she will get for [place]  Asthma - uses Symbicort 2 puffs daily.   This works good for her currently  Was on medication for cholesterol and BP but those have improved with lifestyle changes   Reviewed their medical, surgical, family, social, medication, and allergy history and updated chart as appropriate.  Past Medical History:  Diagnosis Date   Allergy    Asthma    Hyperlipidemia    Hypertension     Family History  Problem Relation Age of Onset   Heart disease Mother    COPD Mother    Breast cancer Mother    Rheum arthritis Mother    Cataracts Mother    Cataracts Father    Breast cancer Maternal Aunt    Cancer Maternal Grandmother        stomach   Stroke Maternal Grandfather    Heart disease Maternal Grandfather    Breast cancer Paternal Grandmother    Alzheimer's disease Paternal Grandfather      Current Outpatient Medications:    estradiol (VIVELLE-DOT) 0.0375 MG/24HR, Place onto the skin., Disp: , Rfl:    progesterone (PROMETRIUM) 100 MG capsule, , Disp: , Rfl:    albuterol (VENTOLIN HFA) 108 (90 Base) MCG/ACT inhaler, Inhale 2 puffs into the lungs every 6 (six) hours as needed for wheezing or shortness of  breath., Disp: 8 g, Rfl: 1   SYMBICORT 160-4.5 MCG/ACT inhaler, Inhale 2 puffs into the lungs in the morning and at bedtime., Disp: 1 each, Rfl: 11  Allergies  Allergen Reactions   Codeine Nausea And Vomiting      Review of Systems Constitutional: -fever, -chills, -sweats, -unexpected weight change, -decreased appetite, -fatigue Allergy: -sneezing, -itching, -congestion Dermatology: -changing moles, --rash, -lumps ENT: -runny nose, -ear pain, -sore throat, -hoarseness, -sinus pain, -teeth pain, - ringing in ears, -hearing loss, -nosebleeds Cardiology: -chest pain, -palpitations, -swelling, -difficulty breathing when lying flat, -waking up short of breath Respiratory: -cough, -shortness of breath, -difficulty breathing with exercise or exertion, -wheezing, -coughing up blood Gastroenterology: +abdominal pain, -nausea, -vomiting, -diarrhea, -constipation, -blood in stool, -changes in bowel movement, -difficulty swallowing or eating Hematology: -bleeding, -bruising  Musculoskeletal: -joint aches, -muscle aches, -joint swelling, -back pain, -neck pain, -cramping, -changes in gait Ophthalmology: denies vision changes, eye redness, itching, discharge Urology: -burning with urination, -difficulty urinating, -blood in urine, -urinary frequency, -urgency, -incontinence Neurology: -headache, -weakness, -tingling, -numbness, -memory loss, -falls, -dizziness Psychology: -depressed mood, -agitation, -sleep problems Breast/gyn: -breast tendnerss, -discharge, -lumps, -vaginal discharge,- irregular periods, -heavy periods   Depression screen [facility] 2/9 04/11/2021 11/11/2020  Decreased Interest 0 0  Down, Depressed, Hopeless 0 0  PHQ - 2 Score 0 0       Objective:  BP  120/70   Pulse 80   Wt 210 lb 12.8 oz (95.6 kg)   BMI 36.18 kg/m   General appearance: alert, no distress, WD/WN, Caucasian female Skin: unremarkable HEENT: normocephalic, conjunctiva/corneas normal, sclerae anicteric, PERRLA,  EOMi Neck: supple, no lymphadenopathy, no thyromegaly, no masses, normal ROM, no bruits Chest: non tender, normal shape and expansion Heart: RRR, normal S1, S2, no murmurs Lungs: CTA bilaterally, no wheezes, rhonchi, or rales Abdomen: +bs, soft, mild tenderness right lateral abdomen, otherwise non tender, non distended, no masses, no hepatomegaly, no splenomegaly, no bruits Back: non tender, normal ROM, no scoliosis Musculoskeletal: upper extremities non tender, no obvious deformity, normal ROM throughout, lower extremities non tender, no obvious deformity, normal ROM throughout Extremities: no edema, no cyanosis, no clubbing Pulses: 2+ symmetric, upper and lower extremities, normal cap refill Neurological: alert, oriented x 3, CN2-12 intact, strength normal upper extremities and lower extremities, sensation normal throughout, DTRs 2+ throughout, no cerebellar signs, gait normal Psychiatric: normal affect, behavior normal, pleasant  Breast: nontender, surgical scars along inferior breasts bilat, no masses or lumps, no skin changes, no nipple discharge or inversion, no axillary lymphadenopathy Gyn: Normal external genitalia without lesions, vagina with normal mucosa, cervix without lesions, no cervical motion tenderness, no abnormal vaginal discharge.  Uterus and adnexa not enlarged, nontender, no masses.  Pap performed.  Exam chaperoned by nurse. Rectal: anus normal appearing   Assessment and Plan :   Encounter Diagnoses  Name Primary?   Encounter for health maintenance examination in adult Yes   Screen for colon cancer    Encounter for screening mammogram for malignant neoplasm of breast    Screening for cervical cancer    Right lower quadrant abdominal pain    Screening for heart disease    Family history of stroke    Family history of heart disease    Moderate persistent asthma without complication    Allergic rhinitis due to pollen, unspecified seasonality      This visit was a  preventative care visit, also known as wellness visit or routine physical.   Topics typically include healthy lifestyle, diet, exercise, preventative care, vaccinations, sick and well care, proper use of emergency dept and after hours care, as well as other concerns.     Recommendations: Continue to return yearly for your annual wellness and preventative care visits.  This gives [place] a chance to discuss healthy lifestyle, exercise, vaccinations, review your chart record, and perform screenings where appropriate.  I recommend you see your eye doctor yearly for routine vision care.  I recommend you see your dentist yearly for routine dental care including hygiene visits twice yearly.   Vaccination recommendations were reviewed Immunization History  Administered Date(s) Administered   PFIZER(Purple Top)SARS-COV-2 Vaccination 10/14/2019, 11/06/2019, 06/08/2020   Tdap 04/09/2017   She is getting the new covid booster this weekend She will return in 3+ weeks for shingrix She will get pneumococcal 23 in a few months   Screening for cancer: Colon cancer screening: We will refer you for Cologuard   Breast cancer screening: You should perform a self breast exam monthly.   We reviewed recommendations for regular mammograms and breast cancer screening.  Cervical cancer screening: We reviewed recommendations for pap smear screening.   Skin cancer screening: Check your skin regularly for new changes, growing lesions, or other lesions of concern Come in for evaluation if you have skin lesions of concern.  Lung cancer screening: If you have a greater than 20 pack year history of tobacco  use, then you may qualify for lung cancer screening with a chest CT scan.   Please call your insurance company to inquire about coverage for this test.  We currently don't have screenings for other cancers besides breast, cervical, colon, and lung cancers.  If you have a strong family history of cancer or have  other cancer screening concerns, please let me know.    Bone health: Get at least 150 minutes of aerobic exercise weekly Get weight bearing exercise at least once weekly Bone density test:  A bone density test is an imaging test that uses a type of X-ray to measure the amount of calcium and other minerals in your bones. The test may be used to diagnose or screen you for a condition that causes weak or thin bones (osteoporosis), predict your risk for a broken bone (fracture), or determine how well your osteoporosis treatment is working. The bone density test is recommended for females 65 and older, or females or males <65 if certain risk factors such as thyroid disease, long term use of steroids such as for asthma or rheumatological issues, vitamin D deficiency, estrogen deficiency, family history of osteoporosis, self or family history of fragility fracture in first degree relative.    Heart health: Get at least 150 minutes of aerobic exercise weekly Limit alcohol It is important to maintain a healthy blood pressure and healthy cholesterol numbers  Heart disease screening: Screening for heart disease includes screening for blood pressure, fasting lipids, glucose/diabetes screening, BMI height to weight ratio, reviewed of smoking status, physical activity, and diet.    Goals include blood pressure 120/80 or less, maintaining a healthy lipid/cholesterol profile, preventing diabetes or keeping diabetes numbers under good control, not smoking or using tobacco products, exercising most days per week or at least 150 minutes per week of exercise, and eating healthy variety of fruits and vegetables, healthy oils, and avoiding unhealthy food choices like fried food, fast food, high sugar and high cholesterol foods.    Other tests may possibly include EKG test, CT coronary calcium score, echocardiogram, exercise treadmill stress test.     Medical care options: I recommend you continue to seek care  here first for routine care.  We try really hard to have available appointments Monday through Friday daytime hours for sick visits, acute visits, and physicals.  Urgent care should be used for after hours and weekends for significant issues that cannot wait till the next day.  The emergency department should be used for significant potentially life-threatening emergencies.  The emergency department is expensive, can often have long wait times for less significant concerns, so try to utilize primary care, urgent care, or telemedicine when possible to avoid unnecessary trips to the emergency department.  Virtual visits and telemedicine have been introduced since the pandemic started in 2020, and can be convenient ways to receive medical care.  We offer virtual appointments as well to assist you in a variety of options to seek medical care.    Separate significant issues discussed: BMI >30 - work on efforts to lose weight through health diet and exercise  Asthma - c/t Symbicort 2 puffs daily,  refilled albuterol inhaler for prn use  Abdominal discomfort - referral for imaging as below  Screen for heart disease - referral for CT coronary score    Andria was seen today for wants referrals.  Diagnoses and all orders for this visit:  Encounter for health maintenance examination in adult -     Cologuard -  Cytology - PAP([place]) -     MM DIGITAL SCREENING BILATERAL; Future -     CT CARDIAC SCORING ([facility] LOCATIONS ONLY); Future  Screen for colon cancer -     Cologuard  Encounter for screening mammogram for malignant neoplasm of breast -     MM DIGITAL SCREENING BILATERAL; Future  Screening for cervical cancer -     Cytology - PAP([place])  Right lower quadrant abdominal pain -     US Abdomen Complete; Future -     US Pelvic Complete With Transvaginal; Future  Screening for heart disease -     CT CARDIAC SCORING ([facility] LOCATIONS ONLY); Future  Family history of stroke -     CT  CARDIAC SCORING ([facility] LOCATIONS ONLY); Future  Family history of heart disease -     CT CARDIAC SCORING ([facility] LOCATIONS ONLY); Future  Moderate persistent asthma without complication  Allergic rhinitis due to pollen, unspecified seasonality  Other orders -     Discontinue: SYMBICORT 160-4.5 MCG/ACT inhaler; Inhale 2 puffs into the lungs in the morning and at bedtime. -     Discontinue: albuterol (VENTOLIN HFA) 108 (90 Base) MCG/ACT inhaler; Inhale 2 puffs into the lungs every 6 (six) hours as needed for wheezing or shortness of breath. -     SYMBICORT 160-4.5 MCG/ACT inhaler; Inhale 2 puffs into the lungs in the morning and at bedtime. -     albuterol (VENTOLIN HFA) 108 (90 Base) MCG/ACT inhaler; Inhale 2 puffs into the lungs every 6 (six) hours as needed for wheezing or shortness of breath.  Follow-up pending labs, yearly for physical

## 2021-04-14 ENCOUNTER — Encounter: Payer: Self-pay | Admitting: Internal Medicine

## 2021-04-14 ENCOUNTER — Other Ambulatory Visit: Payer: Self-pay | Admitting: Internal Medicine

## 2021-04-14 ENCOUNTER — Other Ambulatory Visit: Payer: Self-pay | Admitting: Medical

## 2021-04-14 DIAGNOSIS — R10813 Right lower quadrant abdominal tenderness: Secondary | ICD-10-CM

## 2021-04-14 DIAGNOSIS — Z1231 Encounter for screening mammogram for malignant neoplasm of breast: Secondary | ICD-10-CM

## 2021-04-14 DIAGNOSIS — R10819 Abdominal tenderness, unspecified site: Secondary | ICD-10-CM

## 2021-04-15 ENCOUNTER — Other Ambulatory Visit: Payer: Self-pay | Admitting: Medical

## 2021-04-15 MED ORDER — SYMBICORT 160-4.5 MCG/ACT IN AERO
2.0000 | INHALATION_SPRAY | Freq: Two times a day (BID) | RESPIRATORY_TRACT | 3 refills | Status: DC
Start: 2021-04-15 — End: 2022-05-05

## 2021-04-15 MED ORDER — ALBUTEROL SULFATE HFA 108 (90 BASE) MCG/ACT IN AERS: 2.0000 | INHALATION_SPRAY | Freq: Four times a day (QID) | RESPIRATORY_TRACT | 2 refills | Status: DC | PRN

## 2021-04-16 ENCOUNTER — Encounter: Payer: Self-pay | Admitting: Internal Medicine

## 2021-04-16 LAB — CYTOLOGY - PAP
Adequacy: ABSENT
Comment: NEGATIVE
Diagnosis: NEGATIVE
High risk HPV: NEGATIVE

## 2021-04-16 LAB — HM PAP SMEAR: HM Pap smear: NEGATIVE

## 2021-04-16 LAB — RESULTS CONSOLE HPV: CHL HPV: NEGATIVE

## 2021-05-01 ENCOUNTER — Ambulatory Visit
Admission: RE | Admit: 2021-05-01 | Discharge: 2021-05-01 | Disposition: A | Discharge status: Home / Self Care | Payer: No Typology Code available for payment source | Source: Ambulatory Visit | Attending: Medical | Admitting: Medical

## 2021-05-01 DIAGNOSIS — Z0000 Encounter for general adult medical examination without abnormal findings: Secondary | ICD-10-CM

## 2021-05-01 DIAGNOSIS — Z136 Encounter for screening for cardiovascular disorders: Secondary | ICD-10-CM

## 2021-05-01 DIAGNOSIS — Z8249 Family history of ischemic heart disease and other diseases of the circulatory system: Secondary | ICD-10-CM

## 2021-05-01 DIAGNOSIS — Z823 Family history of stroke: Secondary | ICD-10-CM

## 2021-05-12 DIAGNOSIS — Z1231 Encounter for screening mammogram for malignant neoplasm of breast: Secondary | ICD-10-CM

## 2021-05-19 DIAGNOSIS — Z1211 Encounter for screening for malignant neoplasm of colon: Secondary | ICD-10-CM | POA: Diagnosis not present

## 2021-05-24 LAB — COLOGUARD: Cologuard: NEGATIVE

## 2021-05-26 ENCOUNTER — Encounter: Payer: Self-pay | Admitting: Internal Medicine

## 2021-05-27 ENCOUNTER — Encounter: Payer: Self-pay | Admitting: Medical

## 2021-06-09 ENCOUNTER — Ambulatory Visit
Admission: RE | Admit: 2021-06-09 | Discharge: 2021-06-09 | Disposition: A | Discharge status: Home / Self Care | Payer: BC Managed Care – PPO | Source: Ambulatory Visit | Attending: Medical | Admitting: Medical

## 2021-06-09 ENCOUNTER — Other Ambulatory Visit: Payer: Self-pay

## 2021-06-09 DIAGNOSIS — Z1231 Encounter for screening mammogram for malignant neoplasm of breast: Secondary | ICD-10-CM | POA: Diagnosis not present

## 2021-07-20 DIAGNOSIS — Z20822 Contact with and (suspected) exposure to covid-19: Secondary | ICD-10-CM | POA: Diagnosis not present

## 2021-07-22 DIAGNOSIS — F411 Generalized anxiety disorder: Secondary | ICD-10-CM | POA: Diagnosis not present

## 2021-08-05 DIAGNOSIS — F411 Generalized anxiety disorder: Secondary | ICD-10-CM | POA: Diagnosis not present

## 2021-08-14 DIAGNOSIS — F411 Generalized anxiety disorder: Secondary | ICD-10-CM | POA: Diagnosis not present

## 2021-08-21 DIAGNOSIS — F411 Generalized anxiety disorder: Secondary | ICD-10-CM | POA: Diagnosis not present

## 2021-08-21 DIAGNOSIS — E538 Deficiency of other specified B group vitamins: Secondary | ICD-10-CM | POA: Diagnosis not present

## 2021-08-21 DIAGNOSIS — R635 Abnormal weight gain: Secondary | ICD-10-CM | POA: Diagnosis not present

## 2021-08-21 DIAGNOSIS — N951 Menopausal and female climacteric states: Secondary | ICD-10-CM | POA: Diagnosis not present

## 2021-08-21 DIAGNOSIS — J45909 Unspecified asthma, uncomplicated: Secondary | ICD-10-CM | POA: Diagnosis not present

## 2021-08-25 DIAGNOSIS — Z20822 Contact with and (suspected) exposure to covid-19: Secondary | ICD-10-CM | POA: Diagnosis not present

## 2021-08-28 DIAGNOSIS — F411 Generalized anxiety disorder: Secondary | ICD-10-CM | POA: Diagnosis not present

## 2021-08-29 DIAGNOSIS — M9905 Segmental and somatic dysfunction of pelvic region: Secondary | ICD-10-CM | POA: Diagnosis not present

## 2021-08-29 DIAGNOSIS — M9906 Segmental and somatic dysfunction of lower extremity: Secondary | ICD-10-CM | POA: Diagnosis not present

## 2021-08-29 DIAGNOSIS — M9903 Segmental and somatic dysfunction of lumbar region: Secondary | ICD-10-CM | POA: Diagnosis not present

## 2021-08-29 DIAGNOSIS — M9902 Segmental and somatic dysfunction of thoracic region: Secondary | ICD-10-CM | POA: Diagnosis not present

## 2021-08-29 DIAGNOSIS — M5451 Vertebrogenic low back pain: Secondary | ICD-10-CM | POA: Diagnosis not present

## 2021-09-04 DIAGNOSIS — F411 Generalized anxiety disorder: Secondary | ICD-10-CM | POA: Diagnosis not present

## 2021-09-11 DIAGNOSIS — F411 Generalized anxiety disorder: Secondary | ICD-10-CM | POA: Diagnosis not present

## 2021-09-12 DIAGNOSIS — M5451 Vertebrogenic low back pain: Secondary | ICD-10-CM | POA: Diagnosis not present

## 2021-09-12 DIAGNOSIS — M9906 Segmental and somatic dysfunction of lower extremity: Secondary | ICD-10-CM | POA: Diagnosis not present

## 2021-09-12 DIAGNOSIS — M9903 Segmental and somatic dysfunction of lumbar region: Secondary | ICD-10-CM | POA: Diagnosis not present

## 2021-09-12 DIAGNOSIS — M9905 Segmental and somatic dysfunction of pelvic region: Secondary | ICD-10-CM | POA: Diagnosis not present

## 2021-09-12 DIAGNOSIS — M9902 Segmental and somatic dysfunction of thoracic region: Secondary | ICD-10-CM | POA: Diagnosis not present

## 2021-09-25 DIAGNOSIS — F411 Generalized anxiety disorder: Secondary | ICD-10-CM | POA: Diagnosis not present

## 2021-09-26 DIAGNOSIS — M9905 Segmental and somatic dysfunction of pelvic region: Secondary | ICD-10-CM | POA: Diagnosis not present

## 2021-09-26 DIAGNOSIS — M5451 Vertebrogenic low back pain: Secondary | ICD-10-CM | POA: Diagnosis not present

## 2021-09-26 DIAGNOSIS — M9903 Segmental and somatic dysfunction of lumbar region: Secondary | ICD-10-CM | POA: Diagnosis not present

## 2021-09-26 DIAGNOSIS — M9906 Segmental and somatic dysfunction of lower extremity: Secondary | ICD-10-CM | POA: Diagnosis not present

## 2021-09-26 DIAGNOSIS — M9902 Segmental and somatic dysfunction of thoracic region: Secondary | ICD-10-CM | POA: Diagnosis not present

## 2021-10-06 DIAGNOSIS — F411 Generalized anxiety disorder: Secondary | ICD-10-CM | POA: Diagnosis not present

## 2021-10-09 DIAGNOSIS — E559 Vitamin D deficiency, unspecified: Secondary | ICD-10-CM | POA: Diagnosis not present

## 2021-10-09 DIAGNOSIS — J45909 Unspecified asthma, uncomplicated: Secondary | ICD-10-CM | POA: Diagnosis not present

## 2021-10-09 DIAGNOSIS — N951 Menopausal and female climacteric states: Secondary | ICD-10-CM | POA: Diagnosis not present

## 2021-10-09 DIAGNOSIS — Z131 Encounter for screening for diabetes mellitus: Secondary | ICD-10-CM | POA: Diagnosis not present

## 2021-10-09 DIAGNOSIS — Z1329 Encounter for screening for other suspected endocrine disorder: Secondary | ICD-10-CM | POA: Diagnosis not present

## 2021-10-09 DIAGNOSIS — E538 Deficiency of other specified B group vitamins: Secondary | ICD-10-CM | POA: Diagnosis not present

## 2021-10-09 DIAGNOSIS — Z13828 Encounter for screening for other musculoskeletal disorder: Secondary | ICD-10-CM | POA: Diagnosis not present

## 2021-10-09 DIAGNOSIS — R635 Abnormal weight gain: Secondary | ICD-10-CM | POA: Diagnosis not present

## 2021-10-14 DIAGNOSIS — M9905 Segmental and somatic dysfunction of pelvic region: Secondary | ICD-10-CM | POA: Diagnosis not present

## 2021-10-14 DIAGNOSIS — M9906 Segmental and somatic dysfunction of lower extremity: Secondary | ICD-10-CM | POA: Diagnosis not present

## 2021-10-14 DIAGNOSIS — M5451 Vertebrogenic low back pain: Secondary | ICD-10-CM | POA: Diagnosis not present

## 2021-10-14 DIAGNOSIS — M9903 Segmental and somatic dysfunction of lumbar region: Secondary | ICD-10-CM | POA: Diagnosis not present

## 2021-10-14 DIAGNOSIS — M9902 Segmental and somatic dysfunction of thoracic region: Secondary | ICD-10-CM | POA: Diagnosis not present

## 2021-10-16 DIAGNOSIS — F411 Generalized anxiety disorder: Secondary | ICD-10-CM | POA: Diagnosis not present

## 2021-10-23 DIAGNOSIS — F411 Generalized anxiety disorder: Secondary | ICD-10-CM | POA: Diagnosis not present

## 2021-10-28 DIAGNOSIS — M9902 Segmental and somatic dysfunction of thoracic region: Secondary | ICD-10-CM | POA: Diagnosis not present

## 2021-10-28 DIAGNOSIS — M9903 Segmental and somatic dysfunction of lumbar region: Secondary | ICD-10-CM | POA: Diagnosis not present

## 2021-10-28 DIAGNOSIS — M9906 Segmental and somatic dysfunction of lower extremity: Secondary | ICD-10-CM | POA: Diagnosis not present

## 2021-10-28 DIAGNOSIS — M9905 Segmental and somatic dysfunction of pelvic region: Secondary | ICD-10-CM | POA: Diagnosis not present

## 2021-10-30 DIAGNOSIS — F411 Generalized anxiety disorder: Secondary | ICD-10-CM | POA: Diagnosis not present

## 2021-11-04 DIAGNOSIS — M9906 Segmental and somatic dysfunction of lower extremity: Secondary | ICD-10-CM | POA: Diagnosis not present

## 2021-11-04 DIAGNOSIS — M9903 Segmental and somatic dysfunction of lumbar region: Secondary | ICD-10-CM | POA: Diagnosis not present

## 2021-11-04 DIAGNOSIS — M9905 Segmental and somatic dysfunction of pelvic region: Secondary | ICD-10-CM | POA: Diagnosis not present

## 2021-11-04 DIAGNOSIS — M9902 Segmental and somatic dysfunction of thoracic region: Secondary | ICD-10-CM | POA: Diagnosis not present

## 2021-11-06 DIAGNOSIS — F411 Generalized anxiety disorder: Secondary | ICD-10-CM | POA: Diagnosis not present

## 2021-11-24 DIAGNOSIS — M9906 Segmental and somatic dysfunction of lower extremity: Secondary | ICD-10-CM | POA: Diagnosis not present

## 2021-11-24 DIAGNOSIS — M9903 Segmental and somatic dysfunction of lumbar region: Secondary | ICD-10-CM | POA: Diagnosis not present

## 2021-11-24 DIAGNOSIS — M9905 Segmental and somatic dysfunction of pelvic region: Secondary | ICD-10-CM | POA: Diagnosis not present

## 2021-11-24 DIAGNOSIS — M9902 Segmental and somatic dysfunction of thoracic region: Secondary | ICD-10-CM | POA: Diagnosis not present

## 2021-11-24 DIAGNOSIS — M5451 Vertebrogenic low back pain: Secondary | ICD-10-CM | POA: Diagnosis not present

## 2021-12-04 DIAGNOSIS — F411 Generalized anxiety disorder: Secondary | ICD-10-CM | POA: Diagnosis not present

## 2021-12-08 DIAGNOSIS — M9902 Segmental and somatic dysfunction of thoracic region: Secondary | ICD-10-CM | POA: Diagnosis not present

## 2021-12-08 DIAGNOSIS — M9905 Segmental and somatic dysfunction of pelvic region: Secondary | ICD-10-CM | POA: Diagnosis not present

## 2021-12-08 DIAGNOSIS — M9906 Segmental and somatic dysfunction of lower extremity: Secondary | ICD-10-CM | POA: Diagnosis not present

## 2021-12-08 DIAGNOSIS — M9903 Segmental and somatic dysfunction of lumbar region: Secondary | ICD-10-CM | POA: Diagnosis not present

## 2021-12-09 DIAGNOSIS — F411 Generalized anxiety disorder: Secondary | ICD-10-CM | POA: Diagnosis not present

## 2021-12-25 DIAGNOSIS — F411 Generalized anxiety disorder: Secondary | ICD-10-CM | POA: Diagnosis not present

## 2021-12-30 DIAGNOSIS — M9903 Segmental and somatic dysfunction of lumbar region: Secondary | ICD-10-CM | POA: Diagnosis not present

## 2021-12-30 DIAGNOSIS — M9905 Segmental and somatic dysfunction of pelvic region: Secondary | ICD-10-CM | POA: Diagnosis not present

## 2021-12-30 DIAGNOSIS — M9902 Segmental and somatic dysfunction of thoracic region: Secondary | ICD-10-CM | POA: Diagnosis not present

## 2021-12-30 DIAGNOSIS — M9906 Segmental and somatic dysfunction of lower extremity: Secondary | ICD-10-CM | POA: Diagnosis not present

## 2022-01-01 DIAGNOSIS — F411 Generalized anxiety disorder: Secondary | ICD-10-CM | POA: Diagnosis not present

## 2022-01-08 DIAGNOSIS — F411 Generalized anxiety disorder: Secondary | ICD-10-CM | POA: Diagnosis not present

## 2022-01-15 DIAGNOSIS — F411 Generalized anxiety disorder: Secondary | ICD-10-CM | POA: Diagnosis not present

## 2022-01-20 DIAGNOSIS — M9902 Segmental and somatic dysfunction of thoracic region: Secondary | ICD-10-CM | POA: Diagnosis not present

## 2022-01-20 DIAGNOSIS — M9903 Segmental and somatic dysfunction of lumbar region: Secondary | ICD-10-CM | POA: Diagnosis not present

## 2022-01-20 DIAGNOSIS — M9906 Segmental and somatic dysfunction of lower extremity: Secondary | ICD-10-CM | POA: Diagnosis not present

## 2022-01-20 DIAGNOSIS — M9905 Segmental and somatic dysfunction of pelvic region: Secondary | ICD-10-CM | POA: Diagnosis not present

## 2022-01-22 DIAGNOSIS — F411 Generalized anxiety disorder: Secondary | ICD-10-CM | POA: Diagnosis not present

## 2022-01-29 DIAGNOSIS — F411 Generalized anxiety disorder: Secondary | ICD-10-CM | POA: Diagnosis not present

## 2022-02-02 DIAGNOSIS — F411 Generalized anxiety disorder: Secondary | ICD-10-CM | POA: Diagnosis not present

## 2022-02-17 ENCOUNTER — Ambulatory Visit
Admission: RE | Admit: 2022-02-17 | Discharge: 2022-02-17 | Disposition: A | Discharge status: Home / Self Care | Payer: BC Managed Care – PPO | Source: Ambulatory Visit | Attending: Sports Medicine | Admitting: Sports Medicine

## 2022-02-17 ENCOUNTER — Ambulatory Visit: Payer: BC Managed Care – PPO | Admitting: Sports Medicine

## 2022-02-17 VITALS — BP 134/91 | Ht 64.0 in | Wt 211.0 lb

## 2022-02-17 DIAGNOSIS — M25511 Pain in right shoulder: Secondary | ICD-10-CM | POA: Diagnosis not present

## 2022-02-17 DIAGNOSIS — F411 Generalized anxiety disorder: Secondary | ICD-10-CM | POA: Diagnosis not present

## 2022-02-17 DIAGNOSIS — M19019 Primary osteoarthritis, unspecified shoulder: Secondary | ICD-10-CM

## 2022-02-18 NOTE — Progress Notes (Signed)
   Subjective:    Patient ID: Andrea Hall, female    DOB: 04-May-1966, 56 y.o.   MRN: [id number]  HPI chief complaint: Right shoulder pain  Patient is a very pleasant 56 year old left-hand-dominant female that presents today complaining of superior right shoulder pain that began a few weeks ago without any specific injury.  Pain is most noticeable with putting the arm in certain positions in Pilates.  She has been treated by sports massage therapist with some improvement.  Pain does not radiate.  She has not noticed any weakness.  She does notice that heat seems to help some.  She also enjoys aqua exercise and even swimming is uncomfortable.  No prior shoulder surgeries.    Review of Systems As above    Objective:   Physical Exam  Well-developed, well-nourished.  No acute distress  Right shoulder: Full shoulder range of motion.  She is tender to palpation directly over the [facility] joint with a positive crossarm abduction test.  Rotator cuff strength is 5/5 and does not reproduce pain.  Negative empty can, negative Hawkins.  Negative O'Briens.  Neurovascular intact distally.  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  She will need to continue to modify her exercises in Pilates.  Also recommended that she try some topical treatments in the form of either Voltaren gel or capsaicin.  She will follow-up with me again in 3 weeks.  If symptoms persist then we can consider merits of a cortisone injection versus referral to orthopedic surgery to discuss distal clavicle excision.  This note was dictated using Dragon naturally speaking software and may contain errors in syntax, spelling, or content which have not been identified prior to signing this note.

## 2022-02-25 ENCOUNTER — Telehealth: Payer: Self-pay | Admitting: Sports Medicine

## 2022-02-25 NOTE — Telephone Encounter (Signed)
  I spoke with Andrea Hall on the phone today after reviewing x-rays of her right shoulder.  X-rays show advanced degenerative changes at the [facility] joint.  She has a follow-up appointment with me on August 8 for a complete shoulder ultrasound but I believe her x-ray results confirm what I was thinking clinically.  Andrea Hall also informs me that she is doing pretty well with her shoulder.  She has started using some topical anti-inflammatory medication and her shoulder pain is minimal at this time.  We had previously discussed a cortisone injection before she leaves on her trip but if she has very little pain at the time of follow-up then we will likely not proceed with that.  This note was dictated using Dragon naturally speaking software and may contain errors in syntax, spelling, or content which have not been identified prior to signing this note.

## 2022-02-26 DIAGNOSIS — F411 Generalized anxiety disorder: Secondary | ICD-10-CM | POA: Diagnosis not present

## 2022-03-05 ENCOUNTER — Telehealth: Payer: Self-pay

## 2022-03-05 DIAGNOSIS — F411 Generalized anxiety disorder: Secondary | ICD-10-CM | POA: Diagnosis not present

## 2022-03-05 NOTE — Telephone Encounter (Signed)
Pts. Husband called wanting to become a new pt. Here. I told him we are not currently taking new pts. He wanted me to still ask you because his wife is a pt. Of yours and she wanted him to come here as well.

## 2022-03-10 ENCOUNTER — Ambulatory Visit (INDEPENDENT_AMBULATORY_CARE_PROVIDER_SITE_OTHER): Payer: BC Managed Care – PPO | Admitting: Sports Medicine

## 2022-03-10 ENCOUNTER — Ambulatory Visit: Payer: Self-pay

## 2022-03-10 VITALS — BP 143/89 | Ht 64.0 in

## 2022-03-10 DIAGNOSIS — M25511 Pain in right shoulder: Secondary | ICD-10-CM | POA: Diagnosis not present

## 2022-03-10 NOTE — Progress Notes (Signed)
   Subjective:    Patient ID: Andrea Hall, female    DOB: 1965/12/14, 56 y.o.   MRN: [id number]  HPI  Andrea Hall returns today for follow-up on right shoulder pain.  X-rays show advanced degenerative changes at the acromioclavicular joint.  Overall, she is doing well with her shoulder.  She is starting to get a good understanding of which exercises she should avoid.  She has also found water exercise to be extremely helpful.  Topical medications prior to exercise have also been extremely helpful.   Review of Systems As above    Objective:   Physical Exam  Right shoulder: Good range of motion.  Slight tenderness to palpation directly over the acromioclavicular joint.  Negative empty can.  Rotator cuff strength is 5/5.  Neurovascular intact distally.      Assessment & Plan:   Improving right shoulder pain with x-ray evidence of advanced AC DJD  Since Andrea Hall is doing so well we are going to hold on further work-up or treatment at this point in time.  I do not think ultrasound evaluation will add much to our evaluation today as her clinical exam does not suggest rotator cuff pathology.  Andrea Hall will continue with water exercise and activity modification.  She will also continue with topical medication as needed.  We did discuss cortisone injections down the road if needed.  We also discussed surgical intervention such as distal clavicle excision although her symptoms currently do not warrant such aggressive treatment today.  She will follow-up as needed.  This note was dictated using Dragon naturally speaking software and may contain errors in syntax, spelling, or content which have not been identified prior to signing this note.

## 2022-03-11 ENCOUNTER — Encounter (INDEPENDENT_AMBULATORY_CARE_PROVIDER_SITE_OTHER): Payer: Self-pay

## 2022-03-12 DIAGNOSIS — F411 Generalized anxiety disorder: Secondary | ICD-10-CM | POA: Diagnosis not present

## 2022-03-13 ENCOUNTER — Telehealth: Payer: BC Managed Care – PPO | Admitting: Physician Assistant

## 2022-03-13 DIAGNOSIS — U071 COVID-19: Secondary | ICD-10-CM | POA: Diagnosis not present

## 2022-03-13 MED ORDER — ONDANSETRON HCL 4 MG PO TABS: 4.0000 mg | ORAL_TABLET | Freq: Three times a day (TID) | ORAL | 0 refills | Status: DC | PRN

## 2022-03-13 MED ORDER — MOLNUPIRAVIR EUA 200MG CAPSULE: 4.0000 | ORAL_CAPSULE | Freq: Two times a day (BID) | ORAL | 0 refills | Status: AC

## 2022-03-13 NOTE — Patient Instructions (Signed)
[facility], thank you for joining Margaretann Loveless, PA-C for today's virtual visit.  While this provider is not your primary care provider (PCP), if your PCP is located in our provider database this encounter information will be shared with them immediately following your visit.  Consent: (Patient) Andrea Hall provided verbal consent for this virtual visit at the beginning of the encounter.  Current Medications:  Current Outpatient Medications:    molnupiravir EUA (LAGEVRIO) 200 mg CAPS capsule, Take 4 capsules (800 mg total) by mouth 2 (two) times daily for 5 days., Disp: 40 capsule, Rfl: 0   ondansetron (ZOFRAN) 4 MG tablet, Take 1 tablet (4 mg total) by mouth every 8 (eight) hours as needed for nausea or vomiting., Disp: 20 tablet, Rfl: 0   albuterol (VENTOLIN HFA) 108 (90 Base) MCG/ACT inhaler, Inhale 2 puffs into the lungs every 6 (six) hours as needed for wheezing or shortness of breath., Disp: 18 g, Rfl: 2   estradiol (VIVELLE-DOT) 0.0375 MG/24HR, Place onto the skin., Disp: , Rfl:    progesterone (PROMETRIUM) 100 MG capsule, , Disp: , Rfl:    SYMBICORT 160-4.5 MCG/ACT inhaler, Inhale 2 puffs into the lungs in the morning and at bedtime., Disp: 3 each, Rfl: 3   Medications ordered in this encounter:  Meds ordered this encounter  Medications   ondansetron (ZOFRAN) 4 MG tablet    Sig: Take 1 tablet (4 mg total) by mouth every 8 (eight) hours as needed for nausea or vomiting.    Dispense:  20 tablet    Refill:  0    Order Specific Question:   Supervising Provider    Answer:   MILLER, BRIAN [[id number]]   molnupiravir EUA (LAGEVRIO) 200 mg CAPS capsule    Sig: Take 4 capsules (800 mg total) by mouth 2 (two) times daily for 5 days.    Dispense:  40 capsule    Refill:  0    Order Specific Question:   Supervising Provider    Answer:   Hyacinth Meeker, BRIAN [[id number]]     *If you need refills on other medications prior to your next appointment, please contact your  pharmacy*  Follow-Up: Call back or seek an in-person evaluation if the symptoms worsen or if the condition fails to improve as anticipated.  Other Instructions  Quarantine and Isolation Quarantine and isolation refer to local and travel restrictions to protect the public and travelers from contagious diseases that constitute a public health threat. Contagious diseases are diseases that can spread from one person to another. Quarantine and isolation help to protect the public by preventing exposure to people who have or may have a contagious disease. Isolation separates people who are sick with a contagious disease from people who are not sick. Quarantine separates and restricts the movement of people who were exposed to a contagious disease to see if they become sick. You may be put in quarantine or isolation if you have been exposed to or diagnosed with any of the following diseases: Severe acute respiratory syndromes, such as COVID-19. Cholera. Diphtheria. Tuberculosis. Plague. Smallpox. Yellow fever. Viral hemorrhagic fevers, such as [place], Ebola, and [place]. When to quarantine or isolate Follow these rules, whether you have been vaccinated or not: Stay home and isolate from others when you are sick with a contagious disease. Isolate when you test positive for a contagious disease, even if you do not have symptoms. Isolate if you are sick and suspect that you may have a contagious disease. If you  suspect that you have a contagious disease, get tested. If your test results are negative, you can end your isolation. If your test results are positive, follow the full isolation recommendations as told by your health care provider or local health authorities. Quarantine and stay away from others when you have been in close contact with someone who has tested positive for a contagious disease. Close contact is defined as being less than 6 ft (1.8 m) away from an infected person for a  total of 15 minutes or more over a 24-hour period. Do not go to places where you are unable to wear a mask, such as restaurants and some gyms. Stay home and separate from others as much as possible. Avoid being around people who may get very sick from the contagious disease that you have. Use a separate bathroom, if possible. Do not travel. For travel guidance, visit the CDC's travel webpage at http://espinoza.biz/ Follow these instructions at home: Medicines  Take over-the-counter and prescription medicines as told by your health care provider. Finish all antibiotic medicine even when you start to feel better. Stay up to date with all your vaccines. Get scheduled vaccines and boosters as recommended by your health care provider. Lifestyle Wear a high-quality mask if you must be around others at home and in public, if recommended. Improve air flow (ventilation) at home to help prevent the disease from spreading to other people, if possible. Do not share personal household items, like cups, towels, and utensils. Practice everyday hygiene and cleaning. General instructions Talk to your health care provider if you have a weakened body defense system (immune system). People with a weakened immune system may have a reduced immune response to vaccines. You may need to follow current prevention measures, including wearing a well-fitting mask, avoiding crowds, and avoiding poorly ventilated indoor places. Monitor symptoms and follow health care provider instructions, which may include resting, drinking fluids, and taking medicines. Follow specific isolation and quarantine recommendations if you are in places that can lead to disease outbreaks, such as correctional and detention facilities, homeless shelters, and cruise ships. Return to your normal activities as told by your health care provider. Ask your health care provider what activities are safe for you. Keep all follow-up visits. This is  important. Where to find more information CDC: https://martinez.com/ Contact a health care provider if: You have a fever. You have signs and symptoms that return or get worse after isolation. Get help right away if: You have difficulty breathing. You have chest pain. These symptoms may be an emergency. Get help right away. Call 911. Do not wait to see if the symptoms will go away. Do not drive yourself to the hospital. Summary Isolation and quarantine help protect the public by preventing exposure to people who have or may have a contagious disease. Isolate when you are sick or when you test positive, even if you do not have symptoms. Quarantine and stay away from others when you have been in close contact with someone who has tested positive for a contagious disease. This information is not intended to replace advice given to you by your health care provider. Make sure you discuss any questions you have with your health care provider. Document Revised: 07/31/2021 Document Reviewed: 07/10/2021 Elsevier Patient Education  2023 Elsevier Inc.    If you have been instructed to have an in-person evaluation today at a local Urgent Care facility, please use the link below. It will take you to a list of all of  our available [facility], including address, phone number and hours of operation. Please do not delay care.  [facility]  If you or a family member do not have a primary care provider, use the link below to schedule a visit and establish care. When you choose a [facility] primary care physician or advanced practice provider, you gain a [facility]-term partner in health. Find a Primary Care Provider  Learn more about [facility]'s in-office and virtual care options: [facility]

## 2022-03-13 NOTE — Progress Notes (Signed)
Virtual Visit Consent   Andrea Hall, you are scheduled for a virtual visit with a [facility] provider today. Just as with appointments in the office, your consent must be obtained to participate. Your consent will be active for this visit and any virtual visit you may have with one of our providers in the next 365 days. If you have a MyChart account, a copy of this consent can be sent to you electronically.  As this is a virtual visit, video technology does not allow for your provider to perform a traditional examination. This may limit your provider's ability to fully assess your condition. If your provider identifies any concerns that need to be evaluated in person or the need to arrange testing (such as labs, EKG, etc.), we will make arrangements to do so. Although advances in technology are sophisticated, we cannot ensure that it will always work on either your end or our end. If the connection with a video visit is poor, the visit may have to be switched to a telephone visit. With either a video or telephone visit, we are not always able to ensure that we have a secure connection.  By engaging in this virtual visit, you consent to the provision of healthcare and authorize for your insurance to be billed (if applicable) for the services provided during this visit. Depending on your insurance coverage, you may receive a charge related to this service.  I need to obtain your verbal consent now. Are you willing to proceed with your visit today? Shon Hale Coen has provided verbal consent on 03/13/2022 for a virtual visit (video or telephone). Margaretann Loveless, PA-C  Date: 03/13/2022 9:36 AM  Virtual Visit via Video Note   I, Margaretann Loveless, connected with  Andrea Hall  ([id number], 1966-03-25) on 03/13/22 at  9:30 AM EDT by a video-enabled telemedicine application and verified that I am speaking with the correct person using two identifiers.  Location: Patient: Virtual Visit  Location Patient: Home Provider: Virtual Visit Location Provider: Home Office   I discussed the limitations of evaluation and management by telemedicine and the availability of in person appointments. The patient expressed understanding and agreed to proceed.    History of Present Illness: Andrea Hall is a 56 y.o. who identifies as a female who was assigned female at birth, and is being seen today for Covid 44.  HPI: URI  This is a new problem. The current episode started yesterday (Symptoms started yesterday; tested positive for Covid 19 today). The problem has been gradually worsening. Maximum temperature: 99.5. The fever has been present for 1 to 2 days. Associated symptoms include congestion, coughing, headaches, nausea, rhinorrhea, sinus pain, a sore throat (last night), swollen glands (anterior cervical chain bilateral) and vomiting. Pertinent negatives include no diarrhea, ear pain, plugged ear sensation or wheezing. Associated symptoms comments: Myalgias, chills, fatigue, back pain. She has tried increased fluids, sleep, antihistamine and NSAIDs for the symptoms. The treatment provided mild relief.     Problems:  Patient Active Problem List   Diagnosis Date Noted   Encounter for health maintenance examination in adult 04/11/2021   Screen for colon cancer 04/11/2021   Screening for cervical cancer 04/11/2021   Right lower quadrant abdominal pain 04/11/2021   Moderate persistent asthma without complication 11/11/2020   Allergic rhinitis due to pollen 11/11/2020   Encounter for screening mammogram for malignant neoplasm of breast 11/11/2020   Family history of heart disease 11/11/2020   Family history of  stroke 11/11/2020   Screening for heart disease 11/11/2020    Allergies:  Allergies  Allergen Reactions   Codeine Nausea And Vomiting   Medications:  Current Outpatient Medications:    molnupiravir EUA (LAGEVRIO) 200 mg CAPS capsule, Take 4 capsules (800 mg total) by mouth  2 (two) times daily for 5 days., Disp: 40 capsule, Rfl: 0   ondansetron (ZOFRAN) 4 MG tablet, Take 1 tablet (4 mg total) by mouth every 8 (eight) hours as needed for nausea or vomiting., Disp: 20 tablet, Rfl: 0   albuterol (VENTOLIN HFA) 108 (90 Base) MCG/ACT inhaler, Inhale 2 puffs into the lungs every 6 (six) hours as needed for wheezing or shortness of breath., Disp: 18 g, Rfl: 2   estradiol (VIVELLE-DOT) 0.0375 MG/24HR, Place onto the skin., Disp: , Rfl:    progesterone (PROMETRIUM) 100 MG capsule, , Disp: , Rfl:    SYMBICORT 160-4.5 MCG/ACT inhaler, Inhale 2 puffs into the lungs in the morning and at bedtime., Disp: 3 each, Rfl: 3  Observations/Objective: Patient is well-developed, well-nourished in no acute distress.  Resting comfortably at home.  Head is normocephalic, atraumatic.  No labored breathing.  Speech is clear and coherent with logical content.  Patient is alert and oriented at baseline.    Assessment and Plan: 1. COVID-19 - ondansetron (ZOFRAN) 4 MG tablet; Take 1 tablet (4 mg total) by mouth every 8 (eight) hours as needed for nausea or vomiting.  Dispense: 20 tablet; Refill: 0 - molnupiravir EUA (LAGEVRIO) 200 mg CAPS capsule; Take 4 capsules (800 mg total) by mouth 2 (two) times daily for 5 days.  Dispense: 40 capsule; Refill: 0  - Continue OTC symptomatic management of choice - Will send OTC vitamins and supplement information through AVS - Molnupiravir and Zofran prescribed - Patient enrolled in MyChart symptom monitoring - Push fluids - Rest as needed - Discussed return precautions and when to seek in-person evaluation, sent via AVS as well   Follow Up Instructions: I discussed the assessment and treatment plan with the patient. The patient was provided an opportunity to ask questions and all were answered. The patient agreed with the plan and demonstrated an understanding of the instructions.  A copy of instructions were sent to the patient via MyChart unless  otherwise noted below.    The patient was advised to call back or seek an in-person evaluation if the symptoms worsen or if the condition fails to improve as anticipated.  Time:  I spent 12 minutes with the patient via telehealth technology discussing the above problems/concerns.    Margaretann Loveless, PA-C

## 2022-03-16 ENCOUNTER — Telehealth: Payer: BC Managed Care – PPO | Admitting: Medical

## 2022-03-26 DIAGNOSIS — F411 Generalized anxiety disorder: Secondary | ICD-10-CM | POA: Diagnosis not present

## 2022-04-08 ENCOUNTER — Encounter: Payer: Self-pay | Admitting: Internal Medicine

## 2022-04-14 DIAGNOSIS — F411 Generalized anxiety disorder: Secondary | ICD-10-CM | POA: Diagnosis not present

## 2022-04-22 ENCOUNTER — Telehealth: Payer: Self-pay | Admitting: Medical

## 2022-04-22 NOTE — Telephone Encounter (Signed)
Pt called and is requesting a refills on her estradiol and her progesterone  [id number] - [place], [place] - [address] AT [address] Pt has a cpe on 05/04/2022

## 2022-04-23 DIAGNOSIS — F411 Generalized anxiety disorder: Secondary | ICD-10-CM | POA: Diagnosis not present

## 2022-04-30 DIAGNOSIS — F411 Generalized anxiety disorder: Secondary | ICD-10-CM | POA: Diagnosis not present

## 2022-05-04 ENCOUNTER — Ambulatory Visit: Payer: BC Managed Care – PPO | Admitting: Medical

## 2022-05-04 ENCOUNTER — Encounter: Payer: Self-pay | Admitting: Medical

## 2022-05-04 VITALS — BP 120/68 | HR 67 | Ht 64.0 in | Wt 206.2 lb

## 2022-05-04 DIAGNOSIS — Z8249 Family history of ischemic heart disease and other diseases of the circulatory system: Secondary | ICD-10-CM | POA: Diagnosis not present

## 2022-05-04 DIAGNOSIS — Z13220 Encounter for screening for lipoid disorders: Secondary | ICD-10-CM | POA: Diagnosis not present

## 2022-05-04 DIAGNOSIS — N926 Irregular menstruation, unspecified: Secondary | ICD-10-CM

## 2022-05-04 DIAGNOSIS — F411 Generalized anxiety disorder: Secondary | ICD-10-CM | POA: Insufficient documentation

## 2022-05-04 DIAGNOSIS — Z0000 Encounter for general adult medical examination without abnormal findings: Secondary | ICD-10-CM | POA: Diagnosis not present

## 2022-05-04 DIAGNOSIS — Z1231 Encounter for screening mammogram for malignant neoplasm of breast: Secondary | ICD-10-CM

## 2022-05-04 DIAGNOSIS — Z8349 Family history of other endocrine, nutritional and metabolic diseases: Secondary | ICD-10-CM | POA: Diagnosis not present

## 2022-05-04 DIAGNOSIS — J301 Allergic rhinitis due to pollen: Secondary | ICD-10-CM | POA: Diagnosis not present

## 2022-05-04 DIAGNOSIS — J4540 Moderate persistent asthma, uncomplicated: Secondary | ICD-10-CM

## 2022-05-04 DIAGNOSIS — Z823 Family history of stroke: Secondary | ICD-10-CM

## 2022-05-04 NOTE — Patient Instructions (Signed)
This visit was a preventative care visit, also known as wellness visit or routine physical.   Topics typically include healthy lifestyle, diet, exercise, preventative care, vaccinations, sick and well care, proper use of emergency dept and after hours care, as well as other concerns.     Recommendations: Continue to return yearly for your annual wellness and preventative care visits.  This gives [place] a chance to discuss healthy lifestyle, exercise, vaccinations, review your chart record, and perform screenings where appropriate.  I recommend you see your eye doctor yearly for routine vision care.  I recommend you see your dentist yearly for routine dental care including hygiene visits twice yearly.   Vaccination recommendations were reviewed Immunization History  Administered Date(s) Administered   PFIZER(Purple Top)SARS-COV-2 Vaccination 10/14/2019, 11/06/2019, 06/08/2020   Tdap 04/09/2017   Declines flu shot today  I recommend she return for flu and Prevnar 23 vaccine  And return separate for Shingrix.  Shingles vaccine:  I recommend you have a shingles vaccine to help prevent shingles or herpes zoster outbreak.   Please call your insurer to inquire about coverage for the Shingrix vaccine given in 2 doses.   Some insurers cover this vaccine after age 36, some cover this after age 61.  If your insurer covers this, then call to schedule appointment to have this vaccine here.   Screening for cancer: Colon cancer screening: 2022 Cologuard reviewed, negative  Breast cancer screening: You should perform a self breast exam monthly.   We reviewed recommendations for regular mammograms and breast cancer screening.  Cervical cancer screening: We reviewed recommendations for pap smear screening.   Skin cancer screening: Check your skin regularly for new changes, growing lesions, or other lesions of concern Come in for evaluation if you have skin lesions of concern.  Lung cancer  screening: If you have a greater than 20 pack year history of tobacco use, then you may qualify for lung cancer screening with a chest CT scan.   Please call your insurance company to inquire about coverage for this test.  We currently don't have screenings for other cancers besides breast, cervical, colon, and lung cancers.  If you have a strong family history of cancer or have other cancer screening concerns, please let me know.    Bone health: Get at least 150 minutes of aerobic exercise weekly Get weight bearing exercise at least once weekly Bone density test:  A bone density test is an imaging test that uses a type of X-ray to measure the amount of calcium and other minerals in your bones. The test may be used to diagnose or screen you for a condition that causes weak or thin bones (osteoporosis), predict your risk for a broken bone (fracture), or determine how well your osteoporosis treatment is working. The bone density test is recommended for females 38 and older, or females or males <54 if certain risk factors such as thyroid disease, long term use of steroids such as for asthma or rheumatological issues, vitamin D deficiency, estrogen deficiency, family history of osteoporosis, self or family history of fragility fracture in first degree relative.    Heart health: Get at least 150 minutes of aerobic exercise weekly Limit alcohol It is important to maintain a healthy blood pressure and healthy cholesterol numbers  Heart disease screening: Screening for heart disease includes screening for blood pressure, fasting lipids, glucose/diabetes screening, BMI height to weight ratio, reviewed of smoking status, physical activity, and diet.    Goals include blood pressure  120/80 or less, maintaining a healthy lipid/cholesterol profile, preventing diabetes or keeping diabetes numbers under good control, not smoking or using tobacco products, exercising most days per week or at least 150 minutes  per week of exercise, and eating healthy variety of fruits and vegetables, healthy oils, and avoiding unhealthy food choices like fried food, fast food, high sugar and high cholesterol foods.    CT coronary test negative or zero 04/2021.    Medical care options: I recommend you continue to seek care here first for routine care.  We try really hard to have available appointments Monday through Friday daytime hours for sick visits, acute visits, and physicals.  Urgent care should be used for after hours and weekends for significant issues that cannot wait till the next day.  The emergency department should be used for significant potentially life-threatening emergencies.  The emergency department is expensive, can often have long wait times for less significant concerns, so try to utilize primary care, urgent care, or telemedicine when possible to avoid unnecessary trips to the emergency department.  Virtual visits and telemedicine have been introduced since the pandemic started in 2020, and can be convenient ways to receive medical care.  We offer virtual appointments as well to assist you in a variety of options to seek medical care.    Separate significant issues discussed: BMI >30 - work on efforts to lose weight through health diet and exercise  Asthma - c/t Symbicort 2 puffs daily,  refilled albuterol inhaler for prn use  Irregular period - referral to gynecology

## 2022-05-04 NOTE — Progress Notes (Addendum)
Subjective:   HPI  Andrea Hall is a 56 y.o. female who presents for Chief Complaint  Patient presents with   fasting cpe    Fasting cpe, had a period the last couple months lasting 4-5 days and not had one in 3 years. Declines flu shot today    Patient Care Team: Adedamola Seto, Cleda Mccreedy as PCP - General (Family Medicine) Sees dentist Sees eye doctor [facility] in [place]   Concerns: Had covid infection in 03/2022 for first time.  Had went a few years without periods but for past 2 months has had a period new.   On hormone therapy per [name].    Had not had bleeding since ablation a few years ago.    Asthma - uses Symbicort 2 puffs daily.   This works good for her currently  Was on medication for BP but those have improved with lifestyle changes   Reviewed their medical, surgical, family, social, medication, and allergy history and updated chart as appropriate.  Past Medical History:  Diagnosis Date   Allergy    Asthma    Hyperlipidemia    Hypertension     Family History  Problem Relation Age of Onset   Stroke Mother    Heart disease Mother        heart failure   COPD Mother    Breast cancer Mother    Rheum arthritis Mother    Cataracts Mother    Cataracts Father    Breast cancer Maternal Aunt    Cancer Maternal Grandmother        stomach   Stroke Maternal Grandfather    Heart disease Maternal Grandfather    Breast cancer Paternal Grandmother    Alzheimer's disease Paternal Grandfather      Current Outpatient Medications:    albuterol (VENTOLIN HFA) 108 (90 Base) MCG/ACT inhaler, Inhale 2 puffs into the lungs every 6 (six) hours as needed for wheezing or shortness of breath., Disp: 18 g, Rfl: 2   estradiol (VIVELLE-DOT) 0.0375 MG/24HR, Place onto the skin., Disp: , Rfl:    progesterone (PROMETRIUM) 100 MG capsule, , Disp: , Rfl:    SYMBICORT 160-4.5 MCG/ACT inhaler, Inhale 2 puffs into the lungs in the  morning and at bedtime., Disp: 3 each, Rfl: 3  Allergies  Allergen Reactions   Codeine Nausea And Vomiting      Review of Systems Constitutional: -fever, -chills, -sweats, -unexpected weight change, -decreased appetite, -fatigue Allergy: -sneezing, -itching, -congestion Dermatology: -changing moles, --rash, -lumps ENT: -runny nose, -ear pain, -sore throat, -hoarseness, -sinus pain, -teeth pain, - ringing in ears, -hearing loss, -nosebleeds Cardiology: -chest pain, -palpitations, -swelling, -difficulty breathing when lying flat, -waking up short of breath Respiratory: -cough, -shortness of breath, -difficulty breathing with exercise or exertion, -wheezing, -coughing up blood Gastroenterology: -abdominal pain, -nausea, -vomiting, -diarrhea, -constipation, -blood in stool, -changes in bowel movement, -difficulty swallowing or eating Hematology: -bleeding, -bruising  Musculoskeletal: -joint aches, -muscle aches, -joint swelling, -back pain, -neck pain, -cramping, -changes in gait Ophthalmology: denies vision changes, eye redness, itching, discharge Urology: -burning with urination, -difficulty urinating, -blood in urine, -urinary frequency, -urgency, -incontinence Neurology: -headache, -weakness, -tingling, -numbness, -memory loss, -falls, -dizziness Psychology: -depressed mood, -agitation, -sleep problems Breast/gyn: -breast tendnerss, -discharge, -lumps, -vaginal discharge,+irregular periods, -heavy periods      05/04/2022    2:42 PM 04/11/2021    3:49 PM 11/11/2020    3:51 PM  Depression screen PHQ 2/9  Decreased Interest 0 0 0  Down, Depressed, Hopeless 0 0 0  PHQ - 2 Score 0 0 0       Objective:  BP 120/68   Pulse 67   Ht 5\' 4"  (1.626 m)   Wt 206 lb 3.2 oz (93.5 kg)   BMI 35.39 kg/m   Wt Readings from Last 3 Encounters:  05/04/22 206 lb 3.2 oz (93.5 kg)  02/17/22 211 lb (95.7 kg)  04/11/21 210 lb 12.8 oz (95.6 kg)    General appearance: alert, no distress, WD/WN,  Caucasian female Skin: few small cherry hemangiomas <74mm diameter, chest and right leg, otherwise unremarkable HEENT: normocephalic, conjunctiva/corneas normal, sclerae anicteric, PERRLA, EOMi Neck: supple, no lymphadenopathy, no thyromegaly, no masses, normal ROM, no bruits Chest: non tender, normal shape and expansion Heart: RRR, normal S1, S2, no murmurs Lungs: CTA bilaterally, no wheezes, rhonchi, or rales Abdomen: +bs, soft, mild tenderness right lateral abdomen, otherwise non tender, non distended, no masses, no hepatomegaly, no splenomegaly, no bruits Back: non tender, normal ROM, no scoliosis Musculoskeletal: upper extremities non tender, no obvious deformity, normal ROM throughout, lower extremities non tender, no obvious deformity, normal ROM throughout Extremities: no edema, no cyanosis, no clubbing Pulses: 2+ symmetric, upper and lower extremities, normal cap refill Neurological: alert, oriented x 3, CN2-12 intact, strength normal upper extremities and lower extremities, sensation normal throughout, DTRs 2+ throughout, no cerebellar signs, gait normal Psychiatric: normal affect, behavior normal, pleasant  Breast/gyn/rectal - deferred   Assessment and Plan :   Encounter Diagnoses  Name Primary?   Encounter for health maintenance examination in adult Yes   Allergic rhinitis due to pollen, unspecified seasonality    Encounter for screening mammogram for malignant neoplasm of breast    Family history of heart disease    Family history of stroke    Moderate persistent asthma without complication    Abnormal menstrual periods    Generalized anxiety disorder    Screening for lipid disorders    Family history of thyroid disease       This visit was a preventative care visit, also known as wellness visit or routine physical.   Topics typically include healthy lifestyle, diet, exercise, preventative care, vaccinations, sick and well care, proper use of emergency dept and after  hours care, as well as other concerns.     Recommendations: Continue to return yearly for your annual wellness and preventative care visits.  This gives 1m a chance to discuss healthy lifestyle, exercise, vaccinations, review your chart record, and perform screenings where appropriate.  I recommend you see your eye doctor yearly for routine vision care.  I recommend you see your dentist yearly for routine dental care including hygiene visits twice yearly.   Vaccination recommendations were reviewed Immunization History  Administered Date(s) Administered   PFIZER(Purple Top)SARS-COV-2 Vaccination 10/14/2019, 11/06/2019, 06/08/2020   Tdap 04/09/2017   Declines flu shot today  I recommend she return for flu and Prevnar 23 vaccine  And return separate for Shingrix.  Shingles vaccine:  I recommend you have a shingles vaccine to help prevent shingles or herpes zoster outbreak.   Please call your insurer to inquire about coverage for the Shingrix vaccine given in 2 doses.   Some insurers cover this vaccine after age 72, some cover this after age 51.  If your insurer covers this, then call to schedule appointment to have this vaccine here.   Screening for cancer: Colon cancer screening: 2022 Cologuard reviewed, negative  Breast cancer screening: You should perform a self breast exam  monthly.   We reviewed recommendations for regular mammograms and breast cancer screening.  Cervical cancer screening: We reviewed recommendations for pap smear screening.   Skin cancer screening: Check your skin regularly for new changes, growing lesions, or other lesions of concern Come in for evaluation if you have skin lesions of concern.  Lung cancer screening: If you have a greater than 20 pack year history of tobacco use, then you may qualify for lung cancer screening with a chest CT scan.   Please call your insurance company to inquire about coverage for this test.  We currently don't have  screenings for other cancers besides breast, cervical, colon, and lung cancers.  If you have a strong family history of cancer or have other cancer screening concerns, please let me know.    Bone health: Get at least 150 minutes of aerobic exercise weekly Get weight bearing exercise at least once weekly Bone density test:  A bone density test is an imaging test that uses a type of X-ray to measure the amount of calcium and other minerals in your bones. The test may be used to diagnose or screen you for a condition that causes weak or thin bones (osteoporosis), predict your risk for a broken bone (fracture), or determine how well your osteoporosis treatment is working. The bone density test is recommended for females 65 and older, or females or males <65 if certain risk factors such as thyroid disease, long term use of steroids such as for asthma or rheumatological issues, vitamin D deficiency, estrogen deficiency, family history of osteoporosis, self or family history of fragility fracture in first degree relative.    Heart health: Get at least 150 minutes of aerobic exercise weekly Limit alcohol It is important to maintain a healthy blood pressure and healthy cholesterol numbers  Heart disease screening: Screening for heart disease includes screening for blood pressure, fasting lipids, glucose/diabetes screening, BMI height to weight ratio, reviewed of smoking status, physical activity, and diet.    Goals include blood pressure 120/80 or less, maintaining a healthy lipid/cholesterol profile, preventing diabetes or keeping diabetes numbers under good control, not smoking or using tobacco products, exercising most days per week or at least 150 minutes per week of exercise, and eating healthy variety of fruits and vegetables, healthy oils, and avoiding unhealthy food choices like fried food, fast food, high sugar and high cholesterol foods.    CT coronary test negative or zero  04/2021.    Medical care options: I recommend you continue to seek care here first for routine care.  We try really hard to have available appointments Monday through Friday daytime hours for sick visits, acute visits, and physicals.  Urgent care should be used for after hours and weekends for significant issues that cannot wait till the next day.  The emergency department should be used for significant potentially life-threatening emergencies.  The emergency department is expensive, can often have long wait times for less significant concerns, so try to utilize primary care, urgent care, or telemedicine when possible to avoid unnecessary trips to the emergency department.  Virtual visits and telemedicine have been introduced since the pandemic started in 2020, and can be convenient ways to receive medical care.  We offer virtual appointments as well to assist you in a variety of options to seek medical care.    Separate significant issues discussed: BMI >30 - work on efforts to lose weight through health diet and exercise  Asthma - c/t Symbicort 2 puffs daily,  refilled albuterol inhaler for prn use  Irregular period - referral to gynecology    Rhylin was seen today for fasting cpe.  Diagnoses and all orders for this visit:  Encounter for health maintenance examination in adult -     Comprehensive metabolic panel -     CBC with Differential/Platelet -     TSH + free T4 -     Lipid panel -     VITAMIN D 25 Hydroxy (Vit-D Deficiency, Fractures)  Allergic rhinitis due to pollen, unspecified seasonality  Encounter for screening mammogram for malignant neoplasm of breast -     MM DIGITAL SCREENING BILATERAL; Future  Family history of heart disease  Family history of stroke  Moderate persistent asthma without complication  Abnormal menstrual periods  Generalized anxiety disorder  Screening for lipid disorders -     Lipid panel  Family history of thyroid disease -     TSH +  free T4    Follow-up pending labs, yearly for physical

## 2022-05-04 NOTE — Addendum Note (Signed)
Addended by: Minette Headland A on: 05/04/2022 03:50 PM   Modules accepted: Orders

## 2022-05-05 ENCOUNTER — Other Ambulatory Visit: Payer: Self-pay | Admitting: Medical

## 2022-05-05 LAB — COMPREHENSIVE METABOLIC PANEL
ALT: 13 IU/L (ref 0–32)
AST: 16 IU/L (ref 0–40)
Albumin/Globulin Ratio: 2.1 (ref 1.2–2.2)
Albumin: 4.9 g/dL (ref 3.8–4.9)
Alkaline Phosphatase: 75 IU/L (ref 44–121)
BUN/Creatinine Ratio: 10 (ref 9–23)
BUN: 9 mg/dL (ref 6–24)
Bilirubin Total: 0.3 mg/dL (ref 0.0–1.2)
CO2: 23 mmol/L (ref 20–29)
Calcium: 10 mg/dL (ref 8.7–10.2)
Chloride: 102 mmol/L (ref 96–106)
Creatinine, Ser: 0.9 mg/dL (ref 0.57–1.00)
Globulin, Total: 2.3 g/dL (ref 1.5–4.5)
Glucose: 90 mg/dL (ref 70–99)
Potassium: 4.3 mmol/L (ref 3.5–5.2)
Sodium: 141 mmol/L (ref 134–144)
Total Protein: 7.2 g/dL (ref 6.0–8.5)
eGFR: 75 mL/min/{1.73_m2} (ref >59)

## 2022-05-05 LAB — CBC WITH DIFFERENTIAL/PLATELET
Basophils Absolute: 0 10*3/uL (ref 0.0–0.2)
Basos: 1 %
EOS (ABSOLUTE): 0.2 10*3/uL (ref 0.0–0.4)
Eos: 3 %
Hematocrit: 42.4 % (ref 34.0–46.6)
Hemoglobin: 13.8 g/dL (ref 11.1–15.9)
Immature Grans (Abs): 0 10*3/uL (ref 0.0–0.1)
Immature Granulocytes: 0 %
Lymphocytes Absolute: 1.9 10*3/uL (ref 0.7–3.1)
Lymphs: 29 %
MCH: 28.6 pg (ref 26.6–33.0)
MCHC: 32.5 g/dL (ref 31.5–35.7)
MCV: 88 fL (ref 79–97)
Monocytes Absolute: 0.4 10*3/uL (ref 0.1–0.9)
Monocytes: 7 %
Neutrophils Absolute: 4 10*3/uL (ref 1.4–7.0)
Neutrophils: 60 %
Platelets: 304 10*3/uL (ref 150–450)
RBC: 4.83 x10E6/uL (ref 3.77–5.28)
RDW: 12.7 % (ref 11.7–15.4)
WBC: 6.6 10*3/uL (ref 3.4–10.8)

## 2022-05-05 LAB — LIPID PANEL
Chol/HDL Ratio: 2.9 ratio (ref 0.0–4.4)
Cholesterol, Total: 226 mg/dL — ABNORMAL HIGH (ref 100–199)
HDL: 78 mg/dL (ref >39)
LDL Chol Calc (NIH): 134 mg/dL — ABNORMAL HIGH (ref 0–99)
Triglycerides: 78 mg/dL (ref 0–149)
VLDL Cholesterol Cal: 14 mg/dL (ref 5–40)

## 2022-05-05 LAB — VITAMIN D 25 HYDROXY (VIT D DEFICIENCY, FRACTURES): Vit D, 25-Hydroxy: 45.4 ng/mL (ref 30.0–100.0)

## 2022-05-05 LAB — TSH+FREE T4
Free T4: 1.15 ng/dL (ref 0.82–1.77)
TSH: 1.16 u[IU]/mL (ref 0.450–4.500)

## 2022-05-05 MED ORDER — ALBUTEROL SULFATE HFA 108 (90 BASE) MCG/ACT IN AERS: 2.0000 | INHALATION_SPRAY | Freq: Four times a day (QID) | RESPIRATORY_TRACT | 2 refills | Status: DC | PRN

## 2022-05-05 MED ORDER — SYMBICORT 160-4.5 MCG/ACT IN AERO
2.0000 | INHALATION_SPRAY | Freq: Two times a day (BID) | RESPIRATORY_TRACT | 3 refills | Status: DC
Start: 2022-05-05 — End: 2023-07-05

## 2022-05-07 DIAGNOSIS — F411 Generalized anxiety disorder: Secondary | ICD-10-CM | POA: Diagnosis not present

## 2022-05-12 ENCOUNTER — Encounter: Payer: Self-pay | Admitting: Internal Medicine

## 2022-05-14 DIAGNOSIS — F411 Generalized anxiety disorder: Secondary | ICD-10-CM | POA: Diagnosis not present

## 2022-05-21 DIAGNOSIS — F411 Generalized anxiety disorder: Secondary | ICD-10-CM | POA: Diagnosis not present

## 2022-06-01 DIAGNOSIS — F411 Generalized anxiety disorder: Secondary | ICD-10-CM | POA: Diagnosis not present

## 2022-06-04 DIAGNOSIS — N950 Postmenopausal bleeding: Secondary | ICD-10-CM | POA: Diagnosis not present

## 2022-06-10 DIAGNOSIS — F411 Generalized anxiety disorder: Secondary | ICD-10-CM | POA: Diagnosis not present

## 2022-06-12 ENCOUNTER — Ambulatory Visit
Admission: RE | Admit: 2022-06-12 | Discharge: 2022-06-12 | Disposition: A | Discharge status: Home / Self Care | Payer: BC Managed Care – PPO | Source: Ambulatory Visit | Attending: Medical | Admitting: Medical

## 2022-06-12 DIAGNOSIS — Z1231 Encounter for screening mammogram for malignant neoplasm of breast: Secondary | ICD-10-CM

## 2022-06-15 DIAGNOSIS — N950 Postmenopausal bleeding: Secondary | ICD-10-CM | POA: Diagnosis not present

## 2022-07-02 DIAGNOSIS — F411 Generalized anxiety disorder: Secondary | ICD-10-CM | POA: Diagnosis not present

## 2022-07-09 DIAGNOSIS — F411 Generalized anxiety disorder: Secondary | ICD-10-CM | POA: Diagnosis not present

## 2022-07-13 DIAGNOSIS — F411 Generalized anxiety disorder: Secondary | ICD-10-CM | POA: Diagnosis not present

## 2022-07-20 DIAGNOSIS — F411 Generalized anxiety disorder: Secondary | ICD-10-CM | POA: Diagnosis not present

## 2022-08-07 DIAGNOSIS — F411 Generalized anxiety disorder: Secondary | ICD-10-CM | POA: Diagnosis not present

## 2022-08-12 DIAGNOSIS — E538 Deficiency of other specified B group vitamins: Secondary | ICD-10-CM | POA: Diagnosis not present

## 2022-08-12 DIAGNOSIS — Z9109 Other allergy status, other than to drugs and biological substances: Secondary | ICD-10-CM | POA: Diagnosis not present

## 2022-08-12 DIAGNOSIS — J45909 Unspecified asthma, uncomplicated: Secondary | ICD-10-CM | POA: Diagnosis not present

## 2022-08-12 DIAGNOSIS — N951 Menopausal and female climacteric states: Secondary | ICD-10-CM | POA: Diagnosis not present

## 2022-08-12 DIAGNOSIS — N950 Postmenopausal bleeding: Secondary | ICD-10-CM | POA: Diagnosis not present

## 2022-08-17 DIAGNOSIS — F411 Generalized anxiety disorder: Secondary | ICD-10-CM | POA: Diagnosis not present

## 2022-08-24 DIAGNOSIS — F411 Generalized anxiety disorder: Secondary | ICD-10-CM | POA: Diagnosis not present

## 2022-08-31 DIAGNOSIS — F411 Generalized anxiety disorder: Secondary | ICD-10-CM | POA: Diagnosis not present

## 2022-09-07 DIAGNOSIS — F411 Generalized anxiety disorder: Secondary | ICD-10-CM | POA: Diagnosis not present

## 2022-09-21 DIAGNOSIS — F411 Generalized anxiety disorder: Secondary | ICD-10-CM | POA: Diagnosis not present

## 2022-09-28 DIAGNOSIS — F411 Generalized anxiety disorder: Secondary | ICD-10-CM | POA: Diagnosis not present

## 2022-10-02 DIAGNOSIS — R61 Generalized hyperhidrosis: Secondary | ICD-10-CM | POA: Diagnosis not present

## 2022-10-02 DIAGNOSIS — N950 Postmenopausal bleeding: Secondary | ICD-10-CM | POA: Diagnosis not present

## 2022-10-05 DIAGNOSIS — F411 Generalized anxiety disorder: Secondary | ICD-10-CM | POA: Diagnosis not present

## 2022-10-21 DIAGNOSIS — F411 Generalized anxiety disorder: Secondary | ICD-10-CM | POA: Diagnosis not present

## 2022-11-02 DIAGNOSIS — F411 Generalized anxiety disorder: Secondary | ICD-10-CM | POA: Diagnosis not present

## 2022-11-06 DIAGNOSIS — F411 Generalized anxiety disorder: Secondary | ICD-10-CM | POA: Diagnosis not present

## 2022-11-06 DIAGNOSIS — F900 Attention-deficit hyperactivity disorder, predominantly inattentive type: Secondary | ICD-10-CM | POA: Diagnosis not present

## 2022-11-09 DIAGNOSIS — F411 Generalized anxiety disorder: Secondary | ICD-10-CM | POA: Diagnosis not present

## 2022-11-23 DIAGNOSIS — F411 Generalized anxiety disorder: Secondary | ICD-10-CM | POA: Diagnosis not present

## 2022-11-25 DIAGNOSIS — F900 Attention-deficit hyperactivity disorder, predominantly inattentive type: Secondary | ICD-10-CM | POA: Diagnosis not present

## 2022-11-25 DIAGNOSIS — F411 Generalized anxiety disorder: Secondary | ICD-10-CM | POA: Diagnosis not present

## 2022-11-30 DIAGNOSIS — F900 Attention-deficit hyperactivity disorder, predominantly inattentive type: Secondary | ICD-10-CM | POA: Diagnosis not present

## 2022-12-01 DIAGNOSIS — F411 Generalized anxiety disorder: Secondary | ICD-10-CM | POA: Diagnosis not present

## 2022-12-15 DIAGNOSIS — F411 Generalized anxiety disorder: Secondary | ICD-10-CM | POA: Diagnosis not present

## 2022-12-15 IMAGING — DX DG ANKLE COMPLETE 3+V*R*
3 series · 3 of 3 positions shown · non-contrast
Comparison: None.

CLINICAL DATA: Pain post fall.

EXAM:
RIGHT ANKLE - COMPLETE 3+ VIEW

[ankle ap]
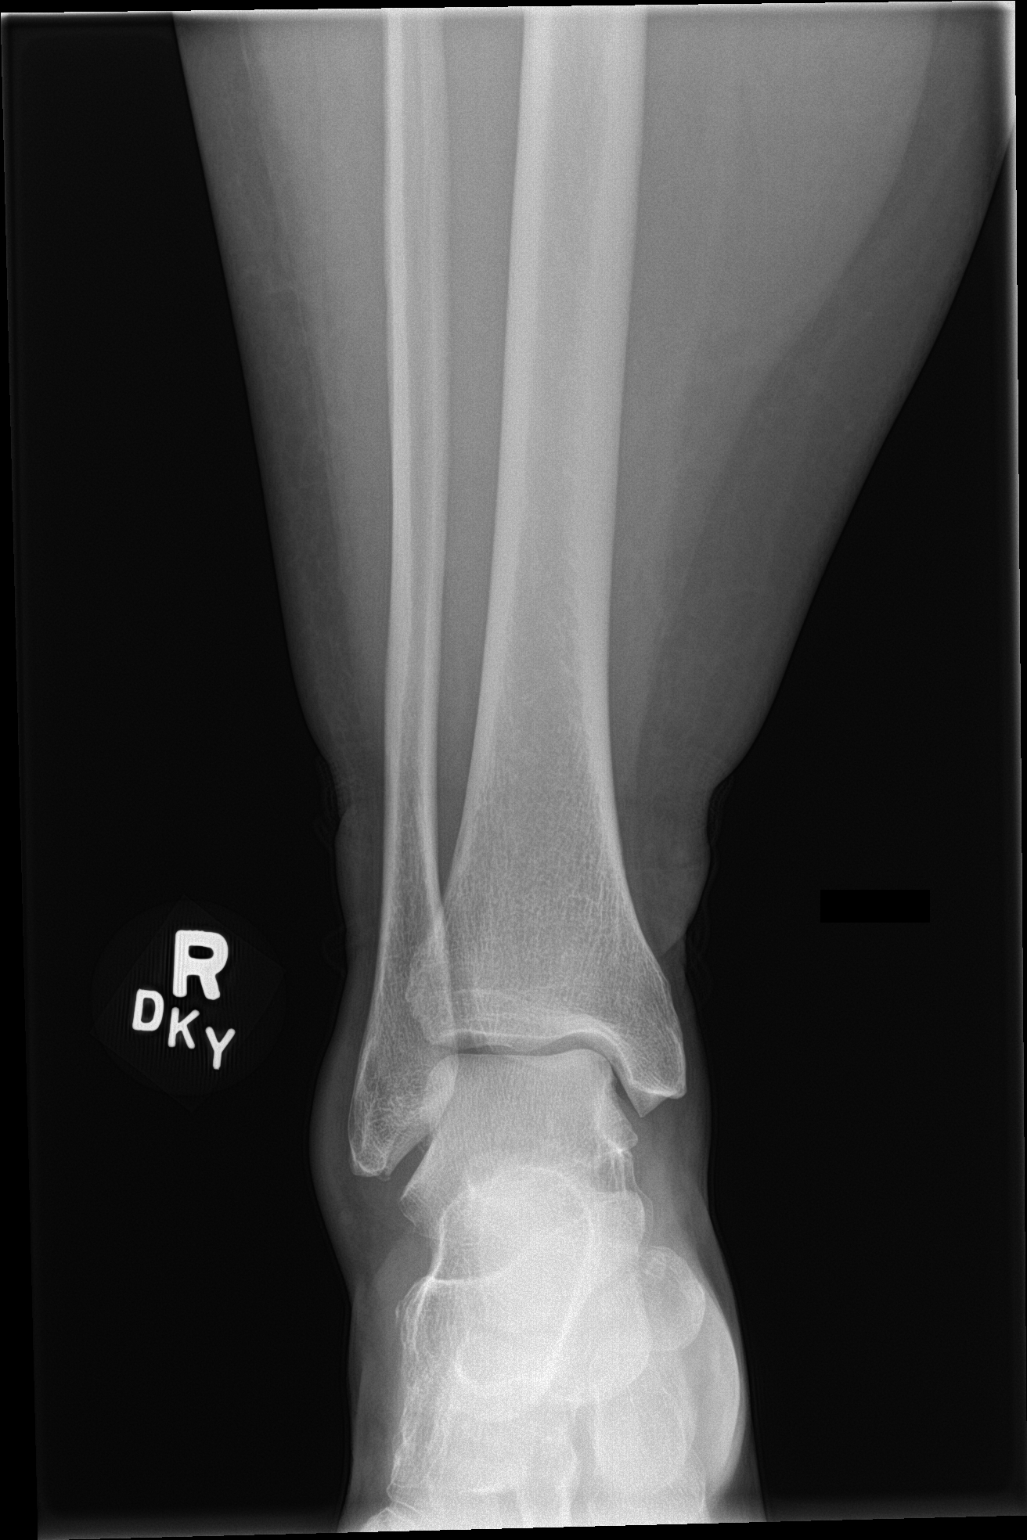

[ankle obl]
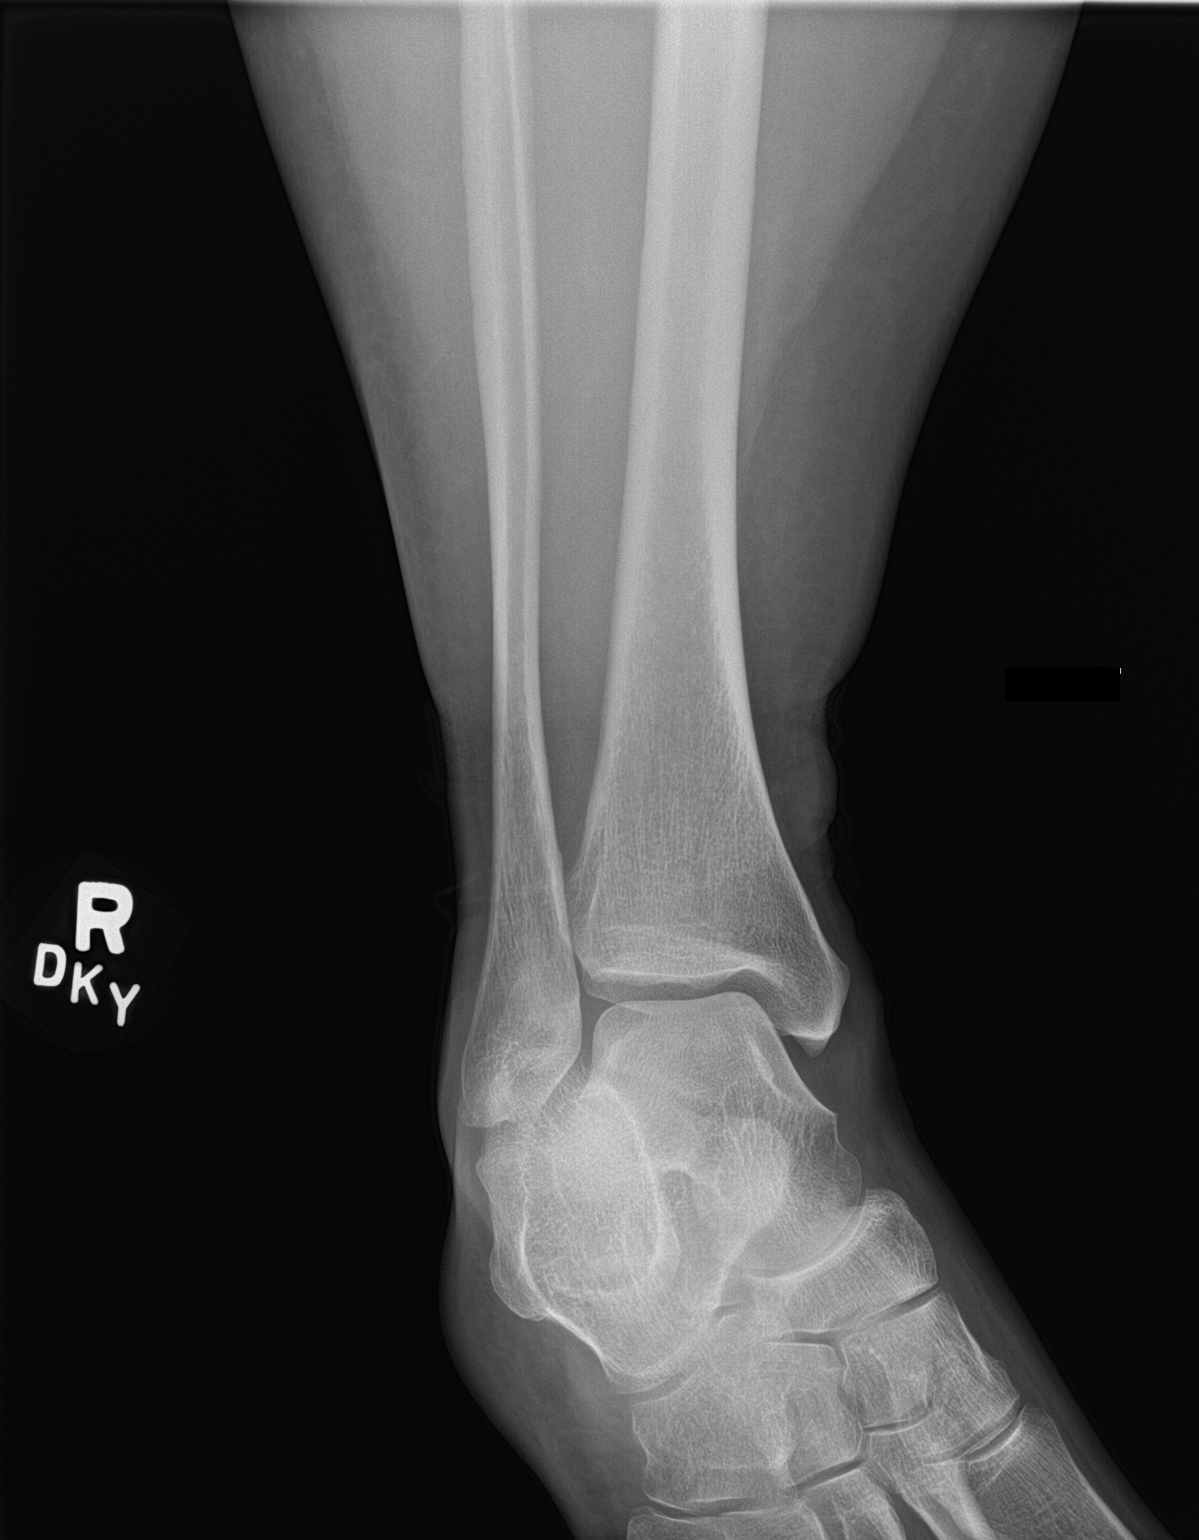

[ankle lat]
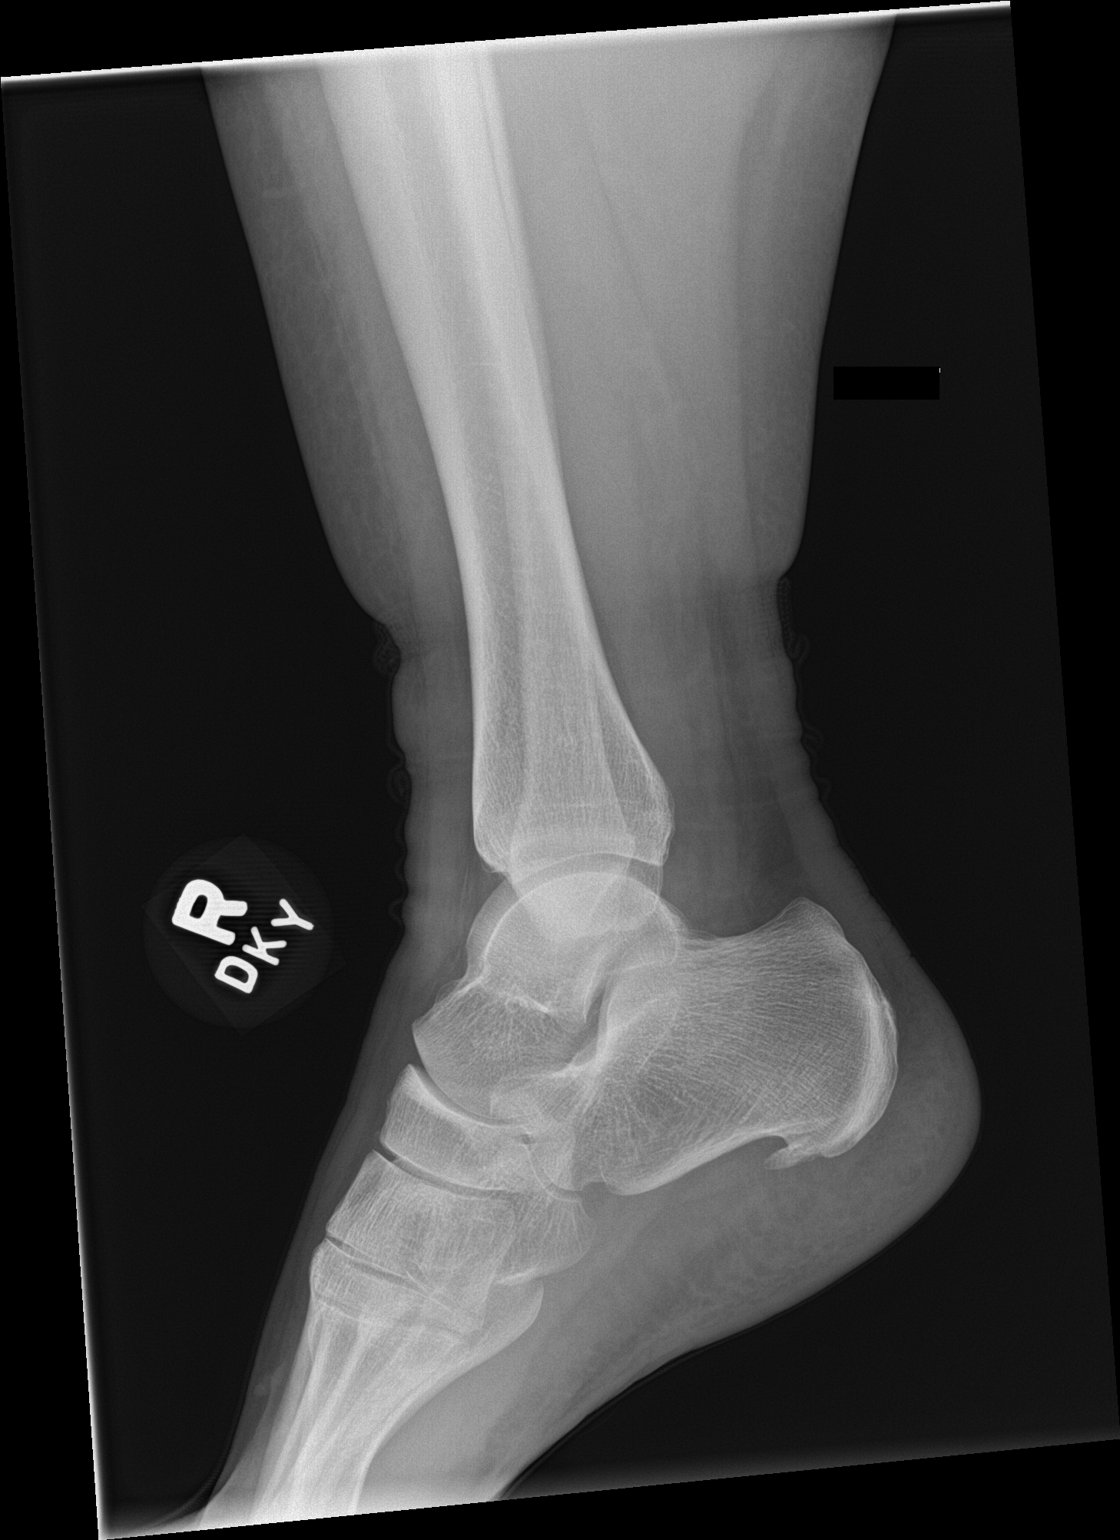

[3 of 3 positions shown; findings below may reference images not displayed]

FINDINGS: There is no evidence of displaced fracture, dislocation, or joint
effusion. Possible tiny avulsion fracture off of the lateral
malleolus. Plantar calcaneal spur. There is no evidence of
arthropathy or other focal bone abnormality. Soft tissue swelling
about the lateral malleolus.
IMPRESSION: 1. Possible tiny avulsion fracture off of the lateral malleolus with
overlying soft tissue swelling.
2. Plantar calcaneal spur.

## 2022-12-18 DIAGNOSIS — F900 Attention-deficit hyperactivity disorder, predominantly inattentive type: Secondary | ICD-10-CM | POA: Diagnosis not present

## 2022-12-18 DIAGNOSIS — F411 Generalized anxiety disorder: Secondary | ICD-10-CM | POA: Diagnosis not present

## 2022-12-22 DIAGNOSIS — F411 Generalized anxiety disorder: Secondary | ICD-10-CM | POA: Diagnosis not present

## 2022-12-29 DIAGNOSIS — F411 Generalized anxiety disorder: Secondary | ICD-10-CM | POA: Diagnosis not present

## 2023-01-07 DIAGNOSIS — F411 Generalized anxiety disorder: Secondary | ICD-10-CM | POA: Diagnosis not present

## 2023-01-15 ENCOUNTER — Telehealth (INDEPENDENT_AMBULATORY_CARE_PROVIDER_SITE_OTHER): Payer: BC Managed Care – PPO | Admitting: Medical

## 2023-01-15 ENCOUNTER — Encounter: Payer: Self-pay | Admitting: Medical

## 2023-01-15 VITALS — HR 76 | Wt 204.0 lb

## 2023-01-15 DIAGNOSIS — F902 Attention-deficit hyperactivity disorder, combined type: Secondary | ICD-10-CM | POA: Diagnosis not present

## 2023-01-15 DIAGNOSIS — R1011 Right upper quadrant pain: Secondary | ICD-10-CM

## 2023-01-15 NOTE — Progress Notes (Addendum)
Subjective: This visit type was conducted due to national recommendations for restrictions regarding the COVID-19 Pandemic (e.g. social distancing) in an effort to limit this patient's exposure and mitigate transmission in our community.  Due to their co-morbid illnesses, this patient is at least at moderate risk for complications without adequate follow up.  This format is felt to be most appropriate for this patient at this time.    Documentation for virtual audio and video telecommunications through [facility] encounter:  The patient was located at home. The provider was located in the office. The patient did consent to this visit and is aware of possible charges through their insurance for this visit.  The other persons participating in this telemedicine service were none. Time spent on call was 20 minutes and in review of previous records >20 minutes total.  This virtual service is not related to other E/M service within previous 7 days.    Andrea Hall is a 57 y.o. female who presents for Chief Complaint  Patient presents with   Medical Management of Chronic Issues    Pain around gallbladder- had massage at back and was told might be gallbladder and have U/S done, just dx with ADHD and has questions about medications that she should take verses side effects. In grad school right now and thinking about needing something     Virtual consult for she was initially scheduled for in person and a few minutes after her appointment time called and said she forgot her appointment.  This was changed to a virtual after the appointment time was already in process.  She has concerns about abdominal pain.  Has been having some tenderness in abdomen, and recent had massage and in the RUQ caused some tendnerss on exam as well. She feels like it is slightly swollen.  Sometimes gets pain after eating.   Has some food sensitivities.  Been avoiding foods that can trigger gall bladder.  Sometimes gets  nausea.    She has questions about ADHD medication.  She was just recently diagnosed with ADHD by [facility].   Currently in grad school.   Never been on this type of medication prior.    No longer on hormones . Saw gyn last year for abnormal uterine bleeding.   Was on hormones at the time and stayed on this for a while.  Had some labs within last 2 months, and she will get me copy of this.   Recent liver test was ok.   No other aggravating or relieving factors.    No other c/o.  Past Medical History:  Diagnosis Date   Allergy    Asthma    Hyperlipidemia    Hypertension    Current Outpatient Medications on File Prior to Visit  Medication Sig Dispense Refill   albuterol (VENTOLIN HFA) 108 (90 Base) MCG/ACT inhaler Inhale 2 puffs into the lungs every 6 (six) hours as needed for wheezing or shortness of breath. 18 g 2   SYMBICORT 160-4.5 MCG/ACT inhaler Inhale 2 puffs into the lungs in the morning and at bedtime. 3 each 3   No current facility-administered medications on file prior to visit.   Past Surgical History:  Procedure Laterality Date   BREAST BIOPSY Right    age 43   COLONOSCOPY  2019   [place], [place]   ENDOMETRIAL ABLATION     REDUCTION MAMMAPLASTY Bilateral 2017   TUBAL LIGATION     Family History  Problem Relation Age of Onset  Stroke Mother    Heart disease Mother        heart failure   COPD Mother    Breast cancer Mother    Rheum arthritis Mother    Cataracts Mother    Cataracts Father    Breast cancer Maternal Aunt    Cancer Maternal Grandmother        stomach   Stroke Maternal Grandfather    Heart disease Maternal Grandfather    Breast cancer Paternal Grandmother    Alzheimer's disease Paternal Grandfather      The following portions of the patient's history were reviewed and updated as appropriate: allergies, current medications, past family history, past medical history, past social history, past surgical history and problem  list.  ROS Otherwise as in subjective above    Objective: Pulse 76   Wt 204 lb (92.5 kg)   BMI 35.02 kg/m  Virtual visit so patient not examined in person  General appearance: alert, no distress, well developed, well nourished Psych: pleasant, good eye contact, answers questions appropriately    Assessment: Encounter Diagnoses  Name Primary?   RUQ abdominal pain Yes   Attention deficit hyperactivity disorder (ADHD), combined type      Plan: Ruq abdominal pain - discussed possible causes.  Discussed limitations of virtual consult.  Referral for baseline US abdomen.   Avoid foods that trigger gall bladder.  ADHD - recently diagnosed by psychiatry.   We discussed possible medications, possible risks/benefits. Advised if she is going to pursue medication that she should have a baseline EKG and recheck BP here.  Discussed importance of medicaiton follow up and monitoring.      Andrea Hall was seen today for medical management of chronic issues.  Diagnoses and all orders for this visit:  RUQ abdominal pain -     US Abdomen Complete; Future  Attention deficit hyperactivity disorder (ADHD), combined type  Spent > 30 minutes face to face with patient in discussion of symptoms, evaluation, plan and recommendations.    Follow up: pending ultrasound

## 2023-01-21 DIAGNOSIS — F411 Generalized anxiety disorder: Secondary | ICD-10-CM | POA: Diagnosis not present

## 2023-01-22 ENCOUNTER — Ambulatory Visit
Admission: RE | Admit: 2023-01-22 | Discharge: 2023-01-22 | Disposition: A | Discharge status: Home / Self Care | Payer: BC Managed Care – PPO | Source: Ambulatory Visit | Attending: Medical | Admitting: Medical

## 2023-01-22 DIAGNOSIS — E538 Deficiency of other specified B group vitamins: Secondary | ICD-10-CM | POA: Diagnosis not present

## 2023-01-22 DIAGNOSIS — Z131 Encounter for screening for diabetes mellitus: Secondary | ICD-10-CM | POA: Diagnosis not present

## 2023-01-22 DIAGNOSIS — R1011 Right upper quadrant pain: Secondary | ICD-10-CM

## 2023-01-22 DIAGNOSIS — Z9109 Other allergy status, other than to drugs and biological substances: Secondary | ICD-10-CM | POA: Diagnosis not present

## 2023-01-22 DIAGNOSIS — N951 Menopausal and female climacteric states: Secondary | ICD-10-CM | POA: Diagnosis not present

## 2023-01-22 DIAGNOSIS — K7689 Other specified diseases of liver: Secondary | ICD-10-CM | POA: Diagnosis not present

## 2023-01-22 DIAGNOSIS — J45909 Unspecified asthma, uncomplicated: Secondary | ICD-10-CM | POA: Diagnosis not present

## 2023-01-22 DIAGNOSIS — E559 Vitamin D deficiency, unspecified: Secondary | ICD-10-CM | POA: Diagnosis not present

## 2023-01-24 NOTE — Progress Notes (Signed)
Results sent through MyChart

## 2023-02-10 DIAGNOSIS — F411 Generalized anxiety disorder: Secondary | ICD-10-CM | POA: Diagnosis not present

## 2023-02-23 DIAGNOSIS — F411 Generalized anxiety disorder: Secondary | ICD-10-CM | POA: Diagnosis not present

## 2023-03-04 DIAGNOSIS — D3132 Benign neoplasm of left choroid: Secondary | ICD-10-CM | POA: Diagnosis not present

## 2023-03-04 DIAGNOSIS — H5203 Hypermetropia, bilateral: Secondary | ICD-10-CM | POA: Diagnosis not present

## 2023-03-05 DIAGNOSIS — J45909 Unspecified asthma, uncomplicated: Secondary | ICD-10-CM | POA: Diagnosis not present

## 2023-03-05 DIAGNOSIS — N951 Menopausal and female climacteric states: Secondary | ICD-10-CM | POA: Diagnosis not present

## 2023-03-05 DIAGNOSIS — Z9109 Other allergy status, other than to drugs and biological substances: Secondary | ICD-10-CM | POA: Diagnosis not present

## 2023-03-05 DIAGNOSIS — E538 Deficiency of other specified B group vitamins: Secondary | ICD-10-CM | POA: Diagnosis not present

## 2023-03-09 DIAGNOSIS — F411 Generalized anxiety disorder: Secondary | ICD-10-CM | POA: Diagnosis not present

## 2023-03-23 DIAGNOSIS — F411 Generalized anxiety disorder: Secondary | ICD-10-CM | POA: Diagnosis not present

## 2023-04-08 DIAGNOSIS — F411 Generalized anxiety disorder: Secondary | ICD-10-CM | POA: Diagnosis not present

## 2023-04-13 DIAGNOSIS — F411 Generalized anxiety disorder: Secondary | ICD-10-CM | POA: Diagnosis not present

## 2023-04-22 DIAGNOSIS — F411 Generalized anxiety disorder: Secondary | ICD-10-CM | POA: Diagnosis not present

## 2023-05-06 DIAGNOSIS — F411 Generalized anxiety disorder: Secondary | ICD-10-CM | POA: Diagnosis not present

## 2023-05-13 DIAGNOSIS — F411 Generalized anxiety disorder: Secondary | ICD-10-CM | POA: Diagnosis not present

## 2023-05-17 DIAGNOSIS — F411 Generalized anxiety disorder: Secondary | ICD-10-CM | POA: Diagnosis not present

## 2023-05-27 DIAGNOSIS — F411 Generalized anxiety disorder: Secondary | ICD-10-CM | POA: Diagnosis not present

## 2023-06-01 DIAGNOSIS — F411 Generalized anxiety disorder: Secondary | ICD-10-CM | POA: Diagnosis not present

## 2023-06-07 DIAGNOSIS — K760 Fatty (change of) liver, not elsewhere classified: Secondary | ICD-10-CM | POA: Diagnosis not present

## 2023-06-07 DIAGNOSIS — Z6835 Body mass index (BMI) 35.0-35.9, adult: Secondary | ICD-10-CM | POA: Diagnosis not present

## 2023-06-07 DIAGNOSIS — E538 Deficiency of other specified B group vitamins: Secondary | ICD-10-CM | POA: Diagnosis not present

## 2023-06-07 DIAGNOSIS — E559 Vitamin D deficiency, unspecified: Secondary | ICD-10-CM | POA: Diagnosis not present

## 2023-06-08 ENCOUNTER — Other Ambulatory Visit: Payer: Self-pay | Admitting: Medical

## 2023-06-08 DIAGNOSIS — Z1231 Encounter for screening mammogram for malignant neoplasm of breast: Secondary | ICD-10-CM

## 2023-06-08 DIAGNOSIS — F411 Generalized anxiety disorder: Secondary | ICD-10-CM | POA: Diagnosis not present

## 2023-06-11 DIAGNOSIS — Z9109 Other allergy status, other than to drugs and biological substances: Secondary | ICD-10-CM | POA: Diagnosis not present

## 2023-06-11 DIAGNOSIS — R635 Abnormal weight gain: Secondary | ICD-10-CM | POA: Diagnosis not present

## 2023-06-11 DIAGNOSIS — J45909 Unspecified asthma, uncomplicated: Secondary | ICD-10-CM | POA: Diagnosis not present

## 2023-06-11 DIAGNOSIS — N951 Menopausal and female climacteric states: Secondary | ICD-10-CM | POA: Diagnosis not present

## 2023-06-15 ENCOUNTER — Inpatient Hospital Stay
Admission: RE | Admit: 2023-06-15 | Discharge: 2023-06-15 | Discharge status: Home / Self Care | Payer: BC Managed Care – PPO | Source: Ambulatory Visit | Attending: Medical | Admitting: Medical

## 2023-06-15 DIAGNOSIS — Z1231 Encounter for screening mammogram for malignant neoplasm of breast: Secondary | ICD-10-CM | POA: Diagnosis not present

## 2023-06-17 DIAGNOSIS — Z1231 Encounter for screening mammogram for malignant neoplasm of breast: Secondary | ICD-10-CM

## 2023-06-17 DIAGNOSIS — F411 Generalized anxiety disorder: Secondary | ICD-10-CM | POA: Diagnosis not present

## 2023-06-17 NOTE — Progress Notes (Signed)
Results sent through MyChart

## 2023-06-24 DIAGNOSIS — F411 Generalized anxiety disorder: Secondary | ICD-10-CM | POA: Diagnosis not present

## 2023-07-03 ENCOUNTER — Other Ambulatory Visit: Payer: Self-pay | Admitting: Medical

## 2023-07-08 DIAGNOSIS — F411 Generalized anxiety disorder: Secondary | ICD-10-CM | POA: Diagnosis not present

## 2023-07-17 IMAGING — CT CT CARDIAC CORONARY ARTERY CALCIUM SCORE
3 series · 14 of 20 positions shown, 16 images · non-contrast
Comparison: None.

CLINICAL DATA: 55-year-old Caucasian female with history of
hyperlipidemia, hypertension and family history of heart disease.

EXAM:
CT CARDIAC CORONARY ARTERY CALCIUM SCORE
TECHNIQUE: Non-contrast imaging through the heart was performed using
prospective ECG gating. Image post processing was performed on an
independent workstation, allowing for quantitative analysis of the
heart and coronary arteries. Note that this exam targets the heart
and the chest was not imaged in its entirety.

[Series 2: calcium scoring 2.00 qr36 bestdiast 69% hrt calciu · axial · 0.37mm/px · z∈[+1662,+1746]mm · 4 of 70 slices shown]
[im 14/70  vessel]
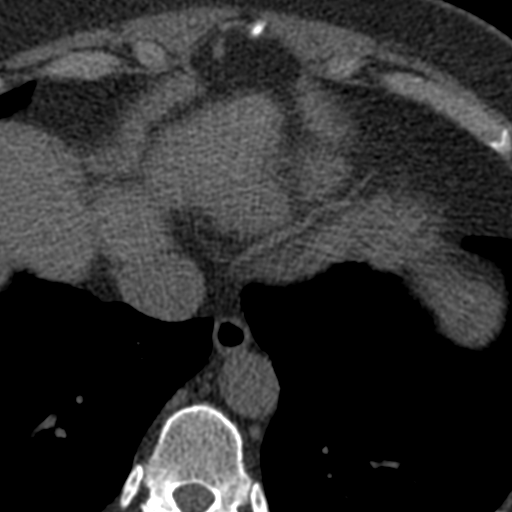
[im 28/70  vessel]
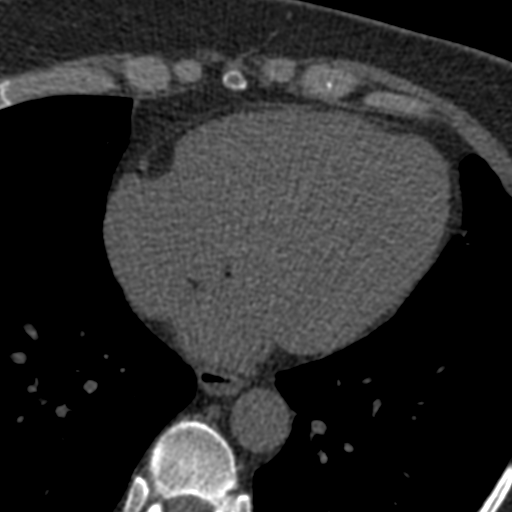
[im 42/70  vessel]
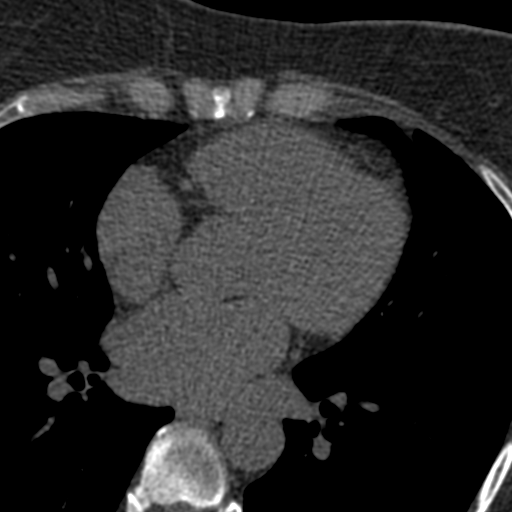
[im 56/70  vessel]
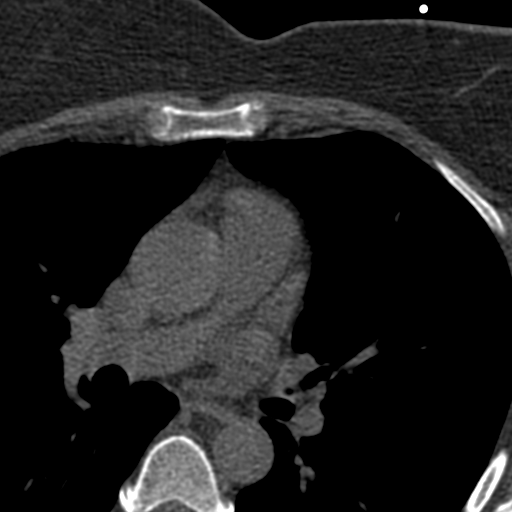

[Series 3: calcium scoring 2.00 br40 bestdiast 69% axial · axial · 0.63mm/px · z∈[+1658,+1750]mm · 5 of 70 slices shown, 7 images]
[im 12/70  vessel]
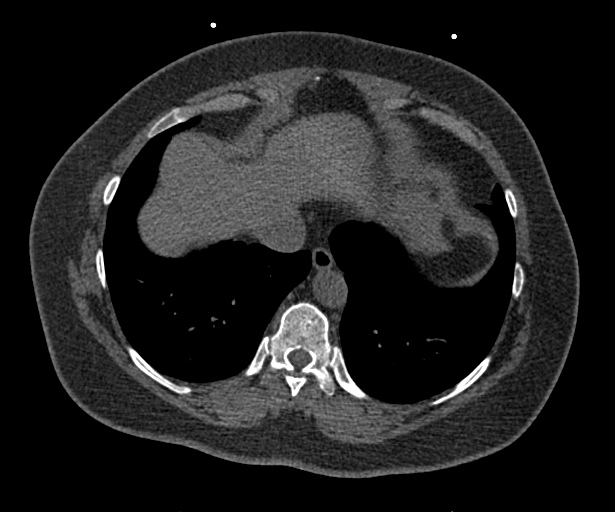
[im 12/70  lung]
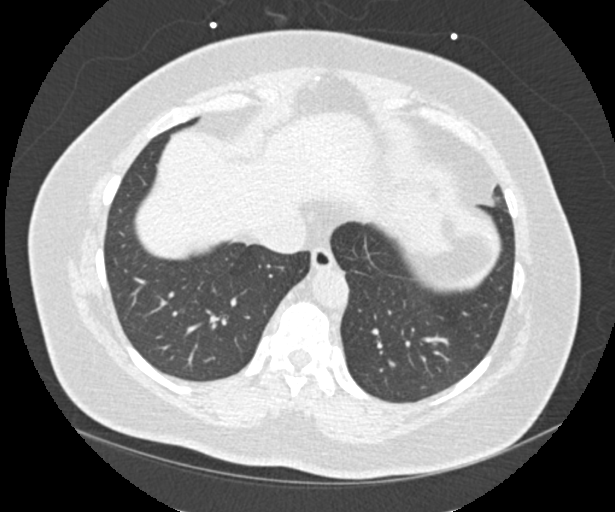
[im 24/70  vessel]
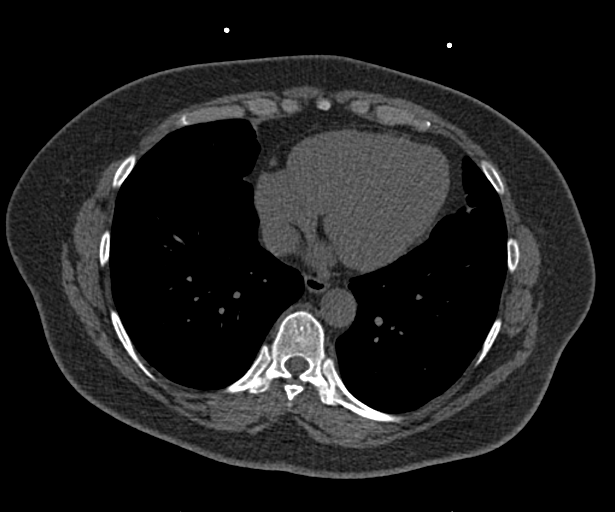
[im 35/70  vessel]
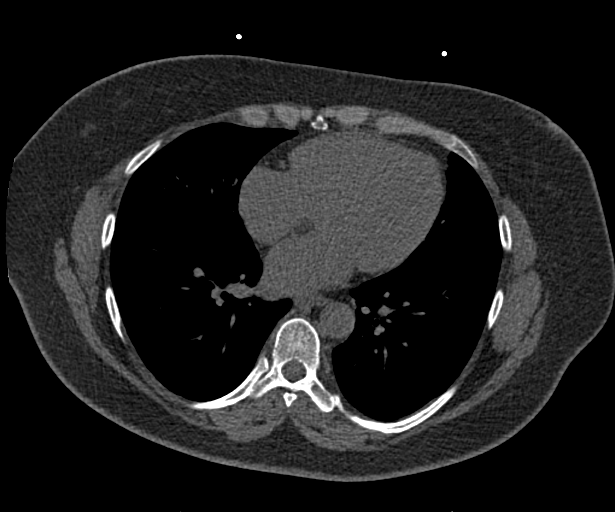
[im 47/70  vessel]
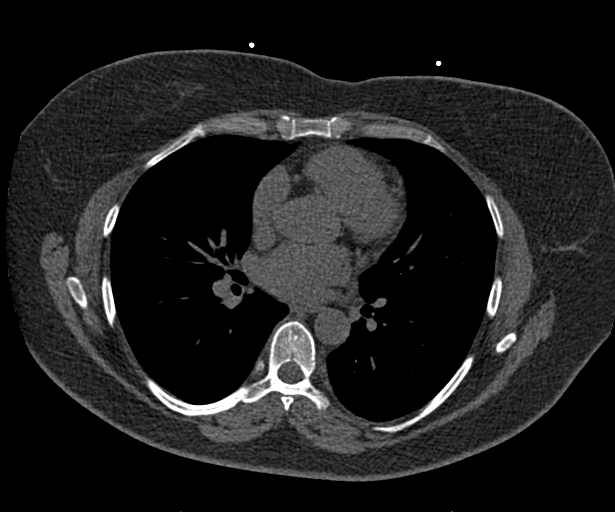
[im 58/70  vessel]
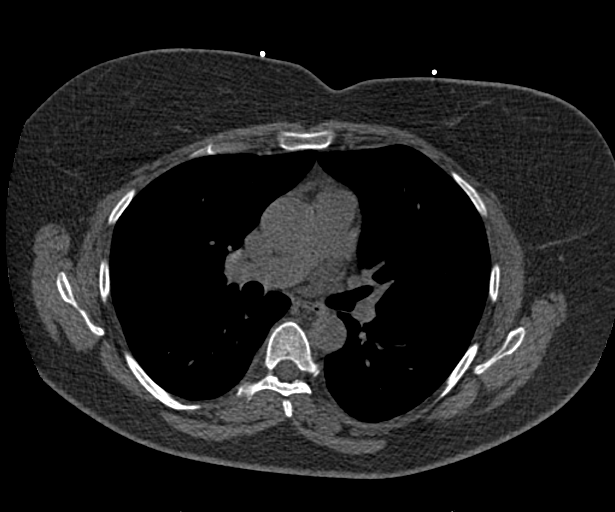
[im 58/70  lung]
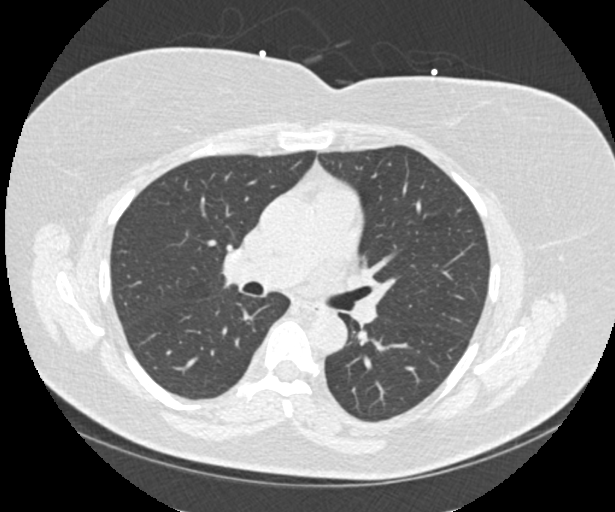

[Series 9: calcium scoring 2.00 br60 bestdiast 69% lungs · axial · 0.63mm/px · z∈[+1658,+1750]mm · 5 of 70 slices shown]
[im 12/70  vessel]
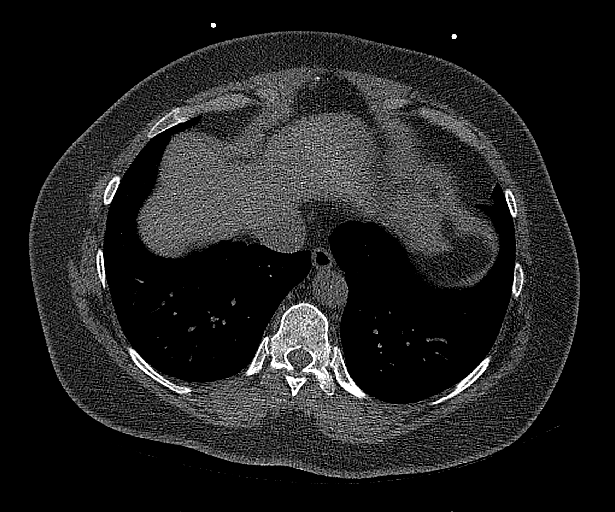
[im 24/70  vessel]
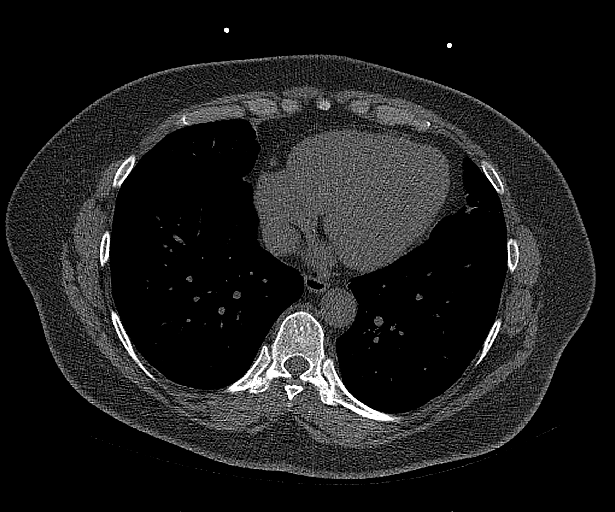
[im 35/70  vessel]
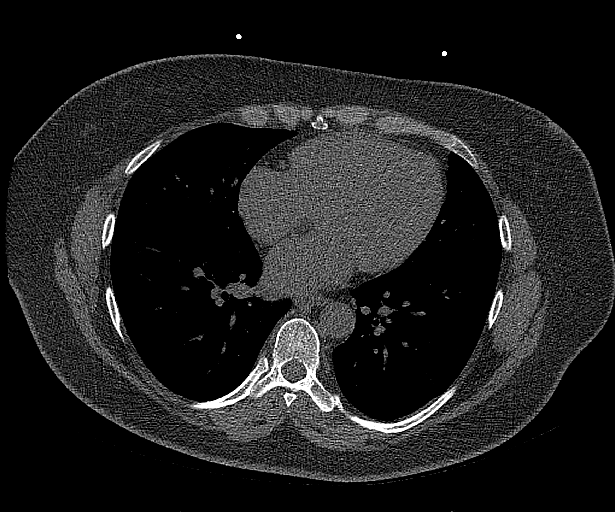
[im 47/70  vessel]
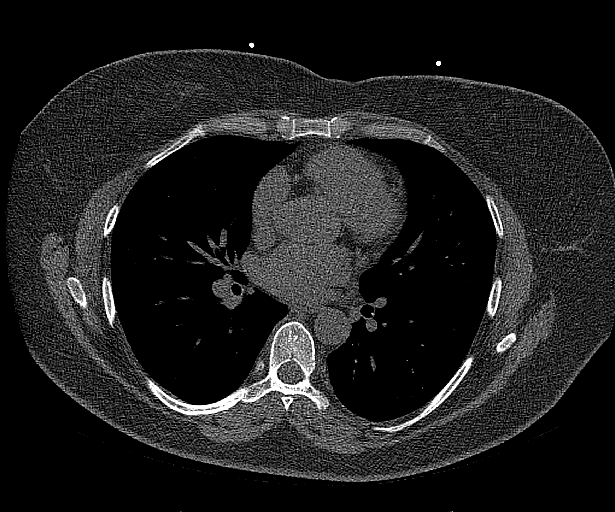
[im 58/70  vessel]
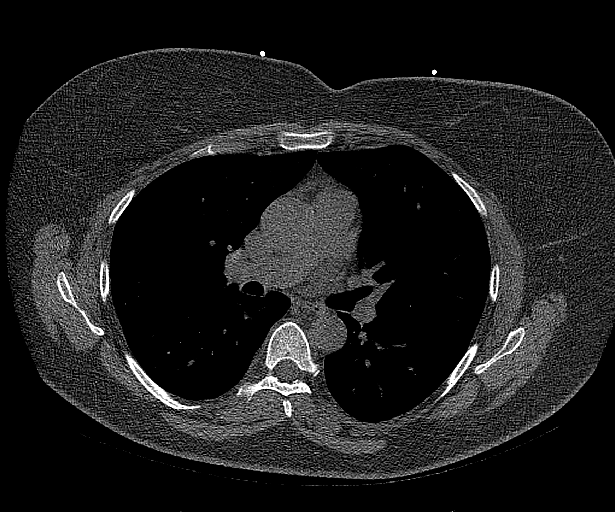

[14 of 20 positions shown; findings below may reference images not displayed]

FINDINGS: CORONARY CALCIUM SCORES:

Left Main: 0

LAD: 0

LCx: 0

RCA: 0

Total Agatston Score: 0

[HOSPITAL] percentile: 0

AORTA MEASUREMENTS:

Ascending Aorta: 32 mm

Descending Aorta: 21 mm

OTHER FINDINGS:


## 2023-07-22 DIAGNOSIS — F411 Generalized anxiety disorder: Secondary | ICD-10-CM | POA: Diagnosis not present

## 2023-08-12 DIAGNOSIS — F411 Generalized anxiety disorder: Secondary | ICD-10-CM | POA: Diagnosis not present

## 2023-08-19 DIAGNOSIS — F411 Generalized anxiety disorder: Secondary | ICD-10-CM | POA: Diagnosis not present

## 2023-08-26 DIAGNOSIS — F411 Generalized anxiety disorder: Secondary | ICD-10-CM | POA: Diagnosis not present

## 2023-09-02 DIAGNOSIS — F411 Generalized anxiety disorder: Secondary | ICD-10-CM | POA: Diagnosis not present

## 2023-09-09 DIAGNOSIS — F411 Generalized anxiety disorder: Secondary | ICD-10-CM | POA: Diagnosis not present

## 2023-09-16 DIAGNOSIS — F411 Generalized anxiety disorder: Secondary | ICD-10-CM | POA: Diagnosis not present

## 2023-09-20 DIAGNOSIS — D3132 Benign neoplasm of left choroid: Secondary | ICD-10-CM | POA: Diagnosis not present

## 2023-09-28 DIAGNOSIS — F411 Generalized anxiety disorder: Secondary | ICD-10-CM | POA: Diagnosis not present

## 2023-10-07 DIAGNOSIS — F411 Generalized anxiety disorder: Secondary | ICD-10-CM | POA: Diagnosis not present

## 2023-10-25 DIAGNOSIS — F411 Generalized anxiety disorder: Secondary | ICD-10-CM | POA: Diagnosis not present

## 2023-11-04 DIAGNOSIS — F411 Generalized anxiety disorder: Secondary | ICD-10-CM | POA: Diagnosis not present

## 2023-11-11 DIAGNOSIS — F411 Generalized anxiety disorder: Secondary | ICD-10-CM | POA: Diagnosis not present

## 2023-11-15 DIAGNOSIS — Z6835 Body mass index (BMI) 35.0-35.9, adult: Secondary | ICD-10-CM | POA: Diagnosis not present

## 2023-11-15 DIAGNOSIS — E559 Vitamin D deficiency, unspecified: Secondary | ICD-10-CM | POA: Diagnosis not present

## 2023-11-15 DIAGNOSIS — K760 Fatty (change of) liver, not elsewhere classified: Secondary | ICD-10-CM | POA: Diagnosis not present

## 2023-11-15 DIAGNOSIS — E538 Deficiency of other specified B group vitamins: Secondary | ICD-10-CM | POA: Diagnosis not present

## 2023-11-18 DIAGNOSIS — F411 Generalized anxiety disorder: Secondary | ICD-10-CM | POA: Diagnosis not present

## 2023-11-23 DIAGNOSIS — F411 Generalized anxiety disorder: Secondary | ICD-10-CM | POA: Diagnosis not present

## 2023-11-26 DIAGNOSIS — J45909 Unspecified asthma, uncomplicated: Secondary | ICD-10-CM | POA: Diagnosis not present

## 2023-11-26 DIAGNOSIS — R635 Abnormal weight gain: Secondary | ICD-10-CM | POA: Diagnosis not present

## 2023-11-26 DIAGNOSIS — Z9109 Other allergy status, other than to drugs and biological substances: Secondary | ICD-10-CM | POA: Diagnosis not present

## 2023-11-26 DIAGNOSIS — N951 Menopausal and female climacteric states: Secondary | ICD-10-CM | POA: Diagnosis not present

## 2023-12-02 DIAGNOSIS — F411 Generalized anxiety disorder: Secondary | ICD-10-CM | POA: Diagnosis not present

## 2023-12-14 DIAGNOSIS — F411 Generalized anxiety disorder: Secondary | ICD-10-CM | POA: Diagnosis not present

## 2023-12-23 DIAGNOSIS — F411 Generalized anxiety disorder: Secondary | ICD-10-CM | POA: Diagnosis not present

## 2024-01-06 DIAGNOSIS — F411 Generalized anxiety disorder: Secondary | ICD-10-CM | POA: Diagnosis not present

## 2024-01-20 DIAGNOSIS — F411 Generalized anxiety disorder: Secondary | ICD-10-CM | POA: Diagnosis not present

## 2024-02-08 DIAGNOSIS — F411 Generalized anxiety disorder: Secondary | ICD-10-CM | POA: Diagnosis not present

## 2024-02-17 DIAGNOSIS — F411 Generalized anxiety disorder: Secondary | ICD-10-CM | POA: Diagnosis not present

## 2024-03-02 DIAGNOSIS — F411 Generalized anxiety disorder: Secondary | ICD-10-CM | POA: Diagnosis not present

## 2024-03-14 DIAGNOSIS — F411 Generalized anxiety disorder: Secondary | ICD-10-CM | POA: Diagnosis not present

## 2024-03-29 DIAGNOSIS — D3132 Benign neoplasm of left choroid: Secondary | ICD-10-CM | POA: Diagnosis not present

## 2024-03-29 DIAGNOSIS — H5203 Hypermetropia, bilateral: Secondary | ICD-10-CM | POA: Diagnosis not present

## 2024-03-29 DIAGNOSIS — H2513 Age-related nuclear cataract, bilateral: Secondary | ICD-10-CM | POA: Diagnosis not present

## 2024-03-30 DIAGNOSIS — F411 Generalized anxiety disorder: Secondary | ICD-10-CM | POA: Diagnosis not present

## 2024-04-06 DIAGNOSIS — F411 Generalized anxiety disorder: Secondary | ICD-10-CM | POA: Diagnosis not present

## 2024-04-20 DIAGNOSIS — F411 Generalized anxiety disorder: Secondary | ICD-10-CM | POA: Diagnosis not present

## 2024-04-27 DIAGNOSIS — F411 Generalized anxiety disorder: Secondary | ICD-10-CM | POA: Diagnosis not present

## 2024-05-04 DIAGNOSIS — F411 Generalized anxiety disorder: Secondary | ICD-10-CM | POA: Diagnosis not present

## 2024-05-09 DIAGNOSIS — F411 Generalized anxiety disorder: Secondary | ICD-10-CM | POA: Diagnosis not present

## 2024-05-18 DIAGNOSIS — F411 Generalized anxiety disorder: Secondary | ICD-10-CM | POA: Diagnosis not present

## 2024-05-25 DIAGNOSIS — F411 Generalized anxiety disorder: Secondary | ICD-10-CM | POA: Diagnosis not present

## 2024-06-01 DIAGNOSIS — F411 Generalized anxiety disorder: Secondary | ICD-10-CM | POA: Diagnosis not present

## 2024-06-15 DIAGNOSIS — F411 Generalized anxiety disorder: Secondary | ICD-10-CM | POA: Diagnosis not present

## 2024-06-22 ENCOUNTER — Encounter: Payer: Self-pay | Admitting: Medical

## 2024-06-22 ENCOUNTER — Ambulatory Visit: Payer: Self-pay | Admitting: Medical

## 2024-06-22 VITALS — BP 110/68 | HR 78 | Ht 63.5 in | Wt 200.6 lb

## 2024-06-22 DIAGNOSIS — Z0000 Encounter for general adult medical examination without abnormal findings: Secondary | ICD-10-CM | POA: Diagnosis not present

## 2024-06-22 DIAGNOSIS — Z6834 Body mass index (BMI) 34.0-34.9, adult: Secondary | ICD-10-CM

## 2024-06-22 DIAGNOSIS — R7301 Impaired fasting glucose: Secondary | ICD-10-CM

## 2024-06-22 DIAGNOSIS — Z1389 Encounter for screening for other disorder: Secondary | ICD-10-CM

## 2024-06-22 DIAGNOSIS — Z1211 Encounter for screening for malignant neoplasm of colon: Secondary | ICD-10-CM | POA: Diagnosis not present

## 2024-06-22 DIAGNOSIS — Z13220 Encounter for screening for lipoid disorders: Secondary | ICD-10-CM

## 2024-06-22 DIAGNOSIS — Z1231 Encounter for screening mammogram for malignant neoplasm of breast: Secondary | ICD-10-CM | POA: Diagnosis not present

## 2024-06-22 DIAGNOSIS — L989 Disorder of the skin and subcutaneous tissue, unspecified: Secondary | ICD-10-CM | POA: Diagnosis not present

## 2024-06-22 DIAGNOSIS — Z842 Family history of other diseases of the genitourinary system: Secondary | ICD-10-CM

## 2024-06-22 DIAGNOSIS — J301 Allergic rhinitis due to pollen: Secondary | ICD-10-CM

## 2024-06-22 DIAGNOSIS — J4540 Moderate persistent asthma, uncomplicated: Secondary | ICD-10-CM

## 2024-06-22 LAB — LIPID PANEL

## 2024-06-22 MED ORDER — ALBUTEROL SULFATE HFA 108 (90 BASE) MCG/ACT IN AERS: 2.0000 | INHALATION_SPRAY | Freq: Four times a day (QID) | RESPIRATORY_TRACT | 1 refills | Status: AC | PRN

## 2024-06-22 MED ORDER — SYMBICORT 160-4.5 MCG/ACT IN AERO: 2.0000 | INHALATION_SPRAY | Freq: Two times a day (BID) | RESPIRATORY_TRACT | 11 refills | Status: AC

## 2024-06-22 NOTE — Progress Notes (Signed)
 Name: Andrea Hall   Date of Visit: 06/22/24   Date of last visit with me: Visit date not found   CHIEF COMPLAINT:  Chief Complaint  Patient presents with   Annual Exam    nonFasting cpe,        HPI:  Discussed the use of AI scribe software for clinical note transcription with the patient, who gave verbal consent to proceed.  History of Present Illness  Patient Care Team: Andrea Hall, Andrea RAMAN, PA-C as PCP - General (Family Medicine) Dr. Tillman Moats, [name] Dr. Arlyss King, ophthalmology Dr. Slater Door, gynecology Dr. Evalene Chang, sports medicine [facility], Accupuncturist Once monthly massage Dr. Auston Schick, Dentist  [facility] [name] is a 58 year old female who presents for a physical.  She has a history of asthma and allergies, managed with Symbicort  year-round and butylol for exercise-induced symptoms. She is allergic to codeine.  She takes a B12 supplement, 500 micrograms daily, which she finds beneficial. She uses a progesterone  patch and estrogen pills and patch. She started taking semaglutide in March 2025, maintaining weight loss but not losing additional weight beyond her lowest of 196 pounds.  She has a history of elevated blood pressure and cholesterol, which resolved about three to four years ago, and she is not currently on medication for these conditions. A CT coronary calcium test in September 2022 showed a score of zero.  Her surgical history includes tubal ligation, endometrial ablation, and breast biopsy. She experienced spotting in last 2 years, which led to a referral for evaluation, including tests for uterine cancer. The spotting has since stopped, and she has not pursued further surgical intervention.  She reports a family history of PCOS in her daughter and notes similarities in symptoms between her daughter, herself, and her mother. She is concerned about cortisol levels due to her physical stature  and family history.  She engages in regular physical activity, including walking, gardening, and chair yoga. She uses the Simple app and an [employer] Ring to track her exercise, heart rate, and hydration. She receives monthly acupuncture and massages for tension and stiffness, which she finds beneficial.  She has undergone dental procedures, including a root canal and crown replacement this year, and reports a clean bill of health from her dentist. She is not currently sexually active and does not use needles for her semaglutide, which is taken orally.  She consumes alcohol occasionally, about one to two glasses per week, and denies any history of smoking or drug use. 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   Allergies  Allergen Reactions   Codeine Nausea And Vomiting    Past Medical History:  Diagnosis Date   Allergy    Asthma    Hyperlipidemia    Hypertension      Current Outpatient Medications:    cyanocobalamin  (VITAMIN B12) 500 MCG tablet, Take by mouth., Disp: , Rfl:    estradiol  (ESTRACE ) 1 MG tablet, Take 1 mg by mouth daily., Disp: , Rfl:    LYLLANA  0.05 MG/24HR patch, Place 1 patch onto the skin. Ever 2-3 days, Disp: , Rfl:    progesterone  (PROMETRIUM ) 200 MG capsule, Take 200 mg by mouth daily., Disp: , Rfl:    SEMAGLUTIDE-WEIGHT MANAGEMENT [place], , Disp: , Rfl:    albuterol  (VENTOLIN  HFA) 108 (90 Base) MCG/ACT inhaler, Inhale 2 puffs into the lungs every 6 (six) hours  as needed for wheezing or shortness of breath., Disp: 8.5 g, Rfl: 1   SYMBICORT  160-4.5 MCG/ACT inhaler, Inhale 2 puffs into the lungs in the morning and at bedtime., Disp: 30.6 g, Rfl: 11  Family History  Problem Relation Age of Onset   Stroke Mother    Heart disease Mother        heart failure   COPD Mother    Breast cancer Mother    Rheum arthritis Mother    Cataracts Mother    Cataracts Father    Breast cancer Maternal Aunt     Cancer Maternal Grandmother        stomach   Stroke Maternal Grandfather    Heart disease Maternal Grandfather    Breast cancer Paternal Grandmother    Alzheimer's disease Paternal Grandfather     Past Surgical History:  Procedure Laterality Date   BREAST BIOPSY Right    age 62   COLONOSCOPY  2019   [place], [place]    ENDOMETRIAL ABLATION     REDUCTION MAMMAPLASTY Bilateral 2017   THERAPEUTIC ABORTION     in her 37s   TUBAL LIGATION      Review of Systems  Constitutional:  Negative for chills, fever, malaise/fatigue and weight loss.  HENT:  Negative for congestion, ear pain, hearing loss, sore throat and tinnitus.   Eyes:  Negative for blurred vision, pain and redness.  Respiratory:  Negative for cough, hemoptysis and shortness of breath.   Cardiovascular:  Negative for chest pain, palpitations, orthopnea, claudication and leg swelling.  Gastrointestinal:  Negative for abdominal pain, blood in stool, constipation, diarrhea, nausea and vomiting.  Genitourinary:  Negative for dysuria, flank pain, frequency, hematuria and urgency.  Musculoskeletal:  Negative for falls, joint pain and myalgias.  Skin:  Negative for itching and rash.  Neurological:  Negative for dizziness, tingling, speech change, weakness and headaches.  Endo/Heme/Allergies:  Negative for polydipsia. Does not bruise/bleed easily.  Psychiatric/Behavioral:  Negative for depression and memory loss. The patient is not nervous/anxious and does not have insomnia.     OBJECTIVE:       06/22/2024    8:29 AM  Depression screen PHQ 2/9  Decreased Interest 0  Down, Depressed, Hopeless 0  PHQ - 2 Score 0     BP 110/68   Pulse 78   Ht 5' 3.5 (1.613 m)   Wt 200 lb 9.6 oz (91 kg)   SpO2 98%   BMI 34.98 kg/m   BP Readings from Last 3 Encounters:  06/22/24 110/68  05/04/22 120/68  03/10/22 (!) 143/89    Wt Readings from Last 3 Encounters:  06/22/24 200 lb 9.6 oz (91 kg)  01/15/23 204 lb (92.5 kg)   05/04/22 206 lb 3.2 oz (93.5 kg)     General appearence: alert, no distress, WD/WN,  HEENT: normocephalic, sclerae anicteric, PERRLA, EOMi, nares patent, no discharge or erythema, pharynx normal Oral cavity: MMM, no lesions Neck: supple, no lymphadenopathy, no thyromegaly, no masses, no bruits Heart: RRR, normal S1, S2, no murmurs Lungs: CTA bilaterally, no wheezes, rhonchi, or rales Abdomen: +bs, soft, non tender, non distended, no masses, no hepatomegaly, no splenomegaly Back: non tender Musculoskeletal: nontender, no swelling, no obvious deformity Extremities: no edema, no cyanosis, no clubbing Pulses: 2+ symmetric, upper and lower extremities, normal cap refill Neurological: alert, oriented x 3, CN2-12 intact, strength normal upper extremities and lower extremities, sensation normal throughout, DTRs 2+ throughout, no cerebellar signs, gait normal Psychiatric: normal affect, behavior normal, pleasant  Breast/gyn -  deferred to gyn    ASSESSMENT/PLAN:   Encounter Diagnoses  Name Primary?   Encounter for health maintenance examination in adult Yes   Screening for colon cancer    Encounter for screening mammogram for malignant neoplasm of breast    Skin lesion    Moderate persistent asthma without complication    Allergic rhinitis due to pollen, unspecified seasonality    BMI 34.0-34.9,adult    Impaired fasting blood sugar    Family history of PCOS    Screening for hematuria or proteinuria    Screening for lipid disorders      Today you had a preventative care visit or wellness visit.    Topics today may have included healthy lifestyle, diet, exercise, preventative care, vaccinations, sick and well care, proper use of emergency dept and after hours care, as well as other concerns.     Recommendations: Continue to return yearly for your annual wellness and preventative care visits.  This gives us  a chance to discuss healthy lifestyle, exercise, vaccinations, review your  chart record, and perform screenings where appropriate.  I recommend you see your eye doctor yearly for routine vision care.  I recommend you see your dentist yearly for routine dental care including hygiene visits twice yearly.   Vaccination recommendations were reviewed Recommendations include yearly flu shot, shingrix, pneumococcal 20 She Is doing covid and flu shots at pharmacy soon   Screening for cancer: Breast cancer screening: You should perform a self breast exam monthly.   We reviewed recommendations for regular mammograms and breast cancer screening.  Colon cancer screening:  We will refer you for Cologard  I reviewed your cologuard from 05/2021 that was negative.  Cervical cancer screening: We reviewed recommendations for pap smear screening. Has been seeing gynecology and has updated pap after 9/22 pap on file  Skin cancer screening: Check your skin regularly for new changes, growing lesions, or other lesions of concern Come in for evaluation if you have skin lesions of concern.  Lung cancer screening: If you have a greater than 30 pack year history of tobacco use, then you qualify for lung cancer screening with a chest CT scan  We currently don't have screenings for other cancers besides breast, cervical, colon, and lung cancers.  If you have a strong family history of cancer or have other cancer screening concerns, please let me know.    Bone health: Get at least 150 minutes of aerobic exercise weekly Get weight bearing exercise at least once weekly Consider bone density test at age 89.   Heart health: Get at least 150 minutes of aerobic exercise weekly Limit alcohol It is important to maintain a healthy blood pressure and healthy cholesterol numbers  CT coronary calcium test 04/2021, score of zero.    Separate significant issues discussed: Skin concerns-referral to dermatology  Impaired glucose-updated labs today  History of asthma-continue  Symbicort  for maintenance, albuterol  as needed  BMI greater than 30-continue efforts to lose weight through healthy diet and exercise  Andrea Hall was seen today for annual exam.  Diagnoses and all orders for this visit:  Encounter for health maintenance examination in adult -     Lipid panel -     CBC -     Hemoglobin A1c -     Cortisol -     Comprehensive metabolic panel with GFR -     TSH -     Ambulatory referral to Dermatology -     Urinalysis, Routine w reflex  microscopic  Screening for colon cancer -     Cologuard; Future  Encounter for screening mammogram for malignant neoplasm of breast -     MM 3D SCREENING MAMMOGRAM BILATERAL BREAST; Future  Skin lesion -     Ambulatory referral to Dermatology  Moderate persistent asthma without complication  Allergic rhinitis due to pollen, unspecified seasonality  BMI 34.0-34.9,adult -     Cortisol -     TSH  Impaired fasting blood sugar -     Hemoglobin A1c  Family history of PCOS -     Cortisol  Screening for hematuria or proteinuria -     Urinalysis, Routine w reflex microscopic  Screening for lipid disorders -     Lipid panel  Other orders -     SYMBICORT  160-4.5 MCG/ACT inhaler; Inhale 2 puffs into the lungs in the morning and at bedtime. -     albuterol  (VENTOLIN  HFA) 108 (90 Base) MCG/ACT inhaler; Inhale 2 puffs into the lungs every 6 (six) hours as needed for wheezing or shortness of breath.    Follow-up pending labs, yearly for physical           [facility]

## 2024-06-23 ENCOUNTER — Ambulatory Visit: Payer: Self-pay | Admitting: Medical

## 2024-06-23 LAB — HEMOGLOBIN A1C
Est. average glucose Bld gHb Est-mCnc: 114 mg/dL
Hgb A1c MFr Bld: 5.6 % (ref 4.8–5.6)

## 2024-06-23 LAB — CBC
Hematocrit: 43.8 % (ref 34.0–46.6)
Hemoglobin: 14.6 g/dL (ref 11.1–15.9)
MCH: 29.7 pg (ref 26.6–33.0)
MCHC: 33.3 g/dL (ref 31.5–35.7)
MCV: 89 fL (ref 79–97)
Platelets: 319 x10E3/uL (ref 150–450)
RBC: 4.92 x10E6/uL (ref 3.77–5.28)
RDW: 12.5 % (ref 11.7–15.4)
WBC: 5.6 x10E3/uL (ref 3.4–10.8)

## 2024-06-23 LAB — TSH: TSH: 1.87 u[IU]/mL (ref 0.450–4.500)

## 2024-06-23 LAB — URINALYSIS, ROUTINE W REFLEX MICROSCOPIC
Bilirubin, UA: NEGATIVE
Glucose, UA: NEGATIVE
Ketones, UA: NEGATIVE
Leukocytes,UA: NEGATIVE
Nitrite, UA: NEGATIVE
Protein,UA: NEGATIVE
RBC, UA: NEGATIVE
Specific Gravity, UA: 1.017 (ref 1.005–1.030)
Urobilinogen, Ur: 0.2 mg/dL (ref 0.2–1.0)
pH, UA: 6.5 (ref 5.0–7.5)

## 2024-06-23 LAB — COMPREHENSIVE METABOLIC PANEL WITH GFR
ALT: 15 IU/L (ref 0–32)
AST: 23 IU/L (ref 0–40)
Albumin: 4.3 g/dL (ref 3.8–4.9)
Alkaline Phosphatase: 69 IU/L (ref 49–135)
BUN/Creatinine Ratio: 11 (ref 9–23)
BUN: 12 mg/dL (ref 6–24)
Bilirubin Total: 0.4 mg/dL (ref 0.0–1.2)
CO2: 24 mmol/L (ref 20–29)
Calcium: 9.7 mg/dL (ref 8.7–10.2)
Chloride: 104 mmol/L (ref 96–106)
Creatinine, Ser: 1.09 mg/dL — ABNORMAL HIGH (ref 0.57–1.00)
Globulin, Total: 2.4 g/dL (ref 1.5–4.5)
Glucose: 92 mg/dL (ref 70–99)
Potassium: 4.2 mmol/L (ref 3.5–5.2)
Sodium: 141 mmol/L (ref 134–144)
Total Protein: 6.7 g/dL (ref 6.0–8.5)
eGFR: 59 mL/min/1.73 — ABNORMAL LOW (ref >59)

## 2024-06-23 LAB — LIPID PANEL
Chol/HDL Ratio: 2.6 ratio (ref 0.0–4.4)
Cholesterol, Total: 214 mg/dL — ABNORMAL HIGH (ref 100–199)
HDL: 83 mg/dL (ref >39)
LDL Chol Calc (NIH): 115 mg/dL — ABNORMAL HIGH (ref 0–99)
Triglycerides: 90 mg/dL (ref 0–149)
VLDL Cholesterol Cal: 16 mg/dL (ref 5–40)

## 2024-06-23 LAB — CORTISOL: Cortisol: 9.7 ug/dL (ref 6.2–19.4)

## 2024-06-23 NOTE — Progress Notes (Signed)
 Results thru my chart

## 2024-07-11 DIAGNOSIS — F411 Generalized anxiety disorder: Secondary | ICD-10-CM | POA: Diagnosis not present

## 2024-08-10 ENCOUNTER — Ambulatory Visit
Admission: RE | Admit: 2024-08-10 | Discharge: 2024-08-10 | Disposition: A | Discharge status: Home / Self Care | Source: Ambulatory Visit | Attending: Medical | Admitting: Medical

## 2024-08-10 DIAGNOSIS — Z1231 Encounter for screening mammogram for malignant neoplasm of breast: Secondary | ICD-10-CM

## 2025-06-26 ENCOUNTER — Encounter: Admitting: Medical
